# Patient Record
Sex: Male | Born: 1950 | Race: White | Hispanic: No | Marital: Married | Smoking: Never smoker
Health system: Southern US, Community
[De-identification: ages and names within clinical notes are randomized; demographics above are authoritative.]

## PROBLEM LIST (undated history)

## (undated) DIAGNOSIS — I10 Essential (primary) hypertension: Secondary | ICD-10-CM

## (undated) DIAGNOSIS — M1990 Unspecified osteoarthritis, unspecified site: Secondary | ICD-10-CM

## (undated) DIAGNOSIS — E119 Type 2 diabetes mellitus without complications: Secondary | ICD-10-CM

## (undated) DIAGNOSIS — R011 Cardiac murmur, unspecified: Secondary | ICD-10-CM

## (undated) DIAGNOSIS — Z8701 Personal history of pneumonia (recurrent): Secondary | ICD-10-CM

## (undated) DIAGNOSIS — R9431 Abnormal electrocardiogram [ECG] [EKG]: Secondary | ICD-10-CM

## (undated) HISTORY — DX: Essential (primary) hypertension: I10

## (undated) HISTORY — DX: Personal history of pneumonia (recurrent): Z87.01

## (undated) HISTORY — PX: OTHER SURGICAL HISTORY: SHX169

## (undated) HISTORY — DX: Type 2 diabetes mellitus without complications: E11.9

## (undated) HISTORY — DX: Unspecified osteoarthritis, unspecified site: M19.90

---

## 2016-03-14 DIAGNOSIS — H2513 Age-related nuclear cataract, bilateral: Secondary | ICD-10-CM | POA: Diagnosis not present

## 2016-03-14 DIAGNOSIS — H40033 Anatomical narrow angle, bilateral: Secondary | ICD-10-CM | POA: Diagnosis not present

## 2016-08-23 DIAGNOSIS — L308 Other specified dermatitis: Secondary | ICD-10-CM | POA: Diagnosis not present

## 2016-08-23 DIAGNOSIS — D0462 Carcinoma in situ of skin of left upper limb, including shoulder: Secondary | ICD-10-CM | POA: Diagnosis not present

## 2016-08-23 DIAGNOSIS — D225 Melanocytic nevi of trunk: Secondary | ICD-10-CM | POA: Diagnosis not present

## 2016-10-22 DIAGNOSIS — Z85828 Personal history of other malignant neoplasm of skin: Secondary | ICD-10-CM | POA: Diagnosis not present

## 2016-10-22 DIAGNOSIS — C44319 Basal cell carcinoma of skin of other parts of face: Secondary | ICD-10-CM | POA: Diagnosis not present

## 2016-10-22 DIAGNOSIS — Z08 Encounter for follow-up examination after completed treatment for malignant neoplasm: Secondary | ICD-10-CM | POA: Diagnosis not present

## 2016-11-22 DIAGNOSIS — C44319 Basal cell carcinoma of skin of other parts of face: Secondary | ICD-10-CM | POA: Diagnosis not present

## 2016-11-22 DIAGNOSIS — Z08 Encounter for follow-up examination after completed treatment for malignant neoplasm: Secondary | ICD-10-CM | POA: Diagnosis not present

## 2016-11-22 DIAGNOSIS — Z85828 Personal history of other malignant neoplasm of skin: Secondary | ICD-10-CM | POA: Diagnosis not present

## 2017-01-03 DIAGNOSIS — Z08 Encounter for follow-up examination after completed treatment for malignant neoplasm: Secondary | ICD-10-CM | POA: Diagnosis not present

## 2017-01-03 DIAGNOSIS — D225 Melanocytic nevi of trunk: Secondary | ICD-10-CM | POA: Diagnosis not present

## 2017-01-03 DIAGNOSIS — B078 Other viral warts: Secondary | ICD-10-CM | POA: Diagnosis not present

## 2017-01-03 DIAGNOSIS — Z85828 Personal history of other malignant neoplasm of skin: Secondary | ICD-10-CM | POA: Diagnosis not present

## 2017-05-02 DIAGNOSIS — L578 Other skin changes due to chronic exposure to nonionizing radiation: Secondary | ICD-10-CM | POA: Diagnosis not present

## 2017-05-02 DIAGNOSIS — L738 Other specified follicular disorders: Secondary | ICD-10-CM | POA: Diagnosis not present

## 2017-05-02 DIAGNOSIS — L718 Other rosacea: Secondary | ICD-10-CM | POA: Diagnosis not present

## 2017-08-26 DIAGNOSIS — Z08 Encounter for follow-up examination after completed treatment for malignant neoplasm: Secondary | ICD-10-CM | POA: Diagnosis not present

## 2017-08-26 DIAGNOSIS — C4441 Basal cell carcinoma of skin of scalp and neck: Secondary | ICD-10-CM | POA: Diagnosis not present

## 2017-08-26 DIAGNOSIS — Z85828 Personal history of other malignant neoplasm of skin: Secondary | ICD-10-CM | POA: Diagnosis not present

## 2017-08-26 DIAGNOSIS — D225 Melanocytic nevi of trunk: Secondary | ICD-10-CM | POA: Diagnosis not present

## 2017-09-26 DIAGNOSIS — Z08 Encounter for follow-up examination after completed treatment for malignant neoplasm: Secondary | ICD-10-CM | POA: Diagnosis not present

## 2017-09-26 DIAGNOSIS — Z85828 Personal history of other malignant neoplasm of skin: Secondary | ICD-10-CM | POA: Diagnosis not present

## 2018-02-06 DIAGNOSIS — L821 Other seborrheic keratosis: Secondary | ICD-10-CM | POA: Diagnosis not present

## 2018-11-24 ENCOUNTER — Encounter: Payer: Self-pay | Admitting: Family Medicine

## 2018-11-24 ENCOUNTER — Ambulatory Visit (INDEPENDENT_AMBULATORY_CARE_PROVIDER_SITE_OTHER): Payer: BLUE CROSS/BLUE SHIELD

## 2018-11-24 ENCOUNTER — Ambulatory Visit (INDEPENDENT_AMBULATORY_CARE_PROVIDER_SITE_OTHER): Payer: BLUE CROSS/BLUE SHIELD | Admitting: Family Medicine

## 2018-11-24 VITALS — BP 182/112 | HR 73 | Temp 97.3°F | Wt 225.0 lb

## 2018-11-24 DIAGNOSIS — Z1329 Encounter for screening for other suspected endocrine disorder: Secondary | ICD-10-CM

## 2018-11-24 DIAGNOSIS — Z125 Encounter for screening for malignant neoplasm of prostate: Secondary | ICD-10-CM | POA: Diagnosis not present

## 2018-11-24 DIAGNOSIS — Z1159 Encounter for screening for other viral diseases: Secondary | ICD-10-CM

## 2018-11-24 DIAGNOSIS — E1159 Type 2 diabetes mellitus with other circulatory complications: Secondary | ICD-10-CM | POA: Insufficient documentation

## 2018-11-24 DIAGNOSIS — Z23 Encounter for immunization: Secondary | ICD-10-CM | POA: Diagnosis not present

## 2018-11-24 DIAGNOSIS — Z7689 Persons encountering health services in other specified circumstances: Secondary | ICD-10-CM | POA: Diagnosis not present

## 2018-11-24 DIAGNOSIS — G8929 Other chronic pain: Secondary | ICD-10-CM | POA: Diagnosis not present

## 2018-11-24 DIAGNOSIS — M2170 Unequal limb length (acquired), unspecified site: Secondary | ICD-10-CM

## 2018-11-24 DIAGNOSIS — M1612 Unilateral primary osteoarthritis, left hip: Secondary | ICD-10-CM

## 2018-11-24 DIAGNOSIS — I10 Essential (primary) hypertension: Secondary | ICD-10-CM

## 2018-11-24 DIAGNOSIS — Z130 Encounter for screening for diseases of the blood and blood-forming organs and certain disorders involving the immune mechanism: Secondary | ICD-10-CM | POA: Diagnosis not present

## 2018-11-24 DIAGNOSIS — M25552 Pain in left hip: Secondary | ICD-10-CM | POA: Diagnosis not present

## 2018-11-24 MED ORDER — AMLODIPINE BESYLATE 5 MG PO TABS: 5.0000 mg | ORAL_TABLET | Freq: Every day | ORAL | 1 refills | Status: DC

## 2018-11-24 MED ORDER — PREDNISONE 10 MG (21) PO TBPK: ORAL_TABLET | ORAL | 0 refills | Status: DC

## 2018-11-24 NOTE — Progress Notes (Signed)
Subjective: DU:KGURKYHCW care, left hip pain HPI: Gary Lopez is a 67 y.o. male presenting to clinic today for:  1. Left hip pain Patient with a greater than 2-year history of left-sided hip pain.  He points to the anterior lateral aspect of the left hip as a source of pain.  Pain per his report seems to be getting better over last year but does occasionally have severe flares.  His wife who is present this agrees with this.  She notes a noticeable limp and has found that his back seems to be curving now.  She feels like the left lower extremity seems shorter than the right lower extremity.  Patient denies any preceding injury.  He reports a longstanding history of low back pain since he was younger.  Denies any sensation changes.  He does occasionally feel like he has some instability in the left lower extremity but denies any falls.  He has used Advil in efforts to improve pain.  No other therapies or evaluation tried.  2. HTN Patient's wife notes that she has been monitoring his blood pressures at home and they tend to be around 150s / 90s to 100s.  He denies any chest pain.  He occasionally has shortness of breath when he has colds but otherwise tolerates activity without difficulty when not limited by hip pain.  Denies any visual disturbance, dizziness.  Salt intake is moderate.  Wife notes that they are working on this.  She tries to keep him active but again left hip interferes with structured physical activity.  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  3.  Preventative Has not seen a doctor in many years.  He has not had any prostate screening, hepatitis C screening or colon cancer screening as of late.  Last ejaculation was greater than 24 hours ago.  He does report nocturia.  No other lower urinary tract symptoms.  He agrees to tetanus and pneumococcal vaccines but declines influenza vaccine today.  History reviewed. No pertinent past medical history. History  reviewed. No pertinent surgical history. Social History   Socioeconomic History  . Marital status: Married    Spouse name: Not on file  . Number of children: Not on file  . Years of education: Not on file  . Highest education level: Not on file  Occupational History  . Occupation: [employer] operational officer  . Financial resource strain: Not on file  . Food insecurity:    Worry: Not on file    Inability: Not on file  . Transportation needs:    Medical: Not on file    Non-medical: Not on file  Tobacco Use  . Smoking status: Never Smoker  . Smokeless tobacco: Never Used  Substance and Sexual Activity  . Alcohol use: Not Currently  . Drug use: Not Currently  . Sexual activity: Not Currently  Lifestyle  . Physical activity:    Days per week: Not on file    Minutes per session: Not on file  . Stress: Not on file  Relationships  . Social connections:    Talks on phone: Not on file    Gets together: Not on file    Attends religious service: Not on file    Active member of club or organization: Not on file    Attends meetings of clubs or organizations: Not on file    Relationship status: Not on file  . Intimate partner violence:    Fear of  current or ex partner: Not on file    Emotionally abused: Not on file    Physically abused: Not on file    Forced sexual activity: Not on file  Other Topics Concern  . Not on file  Social History Narrative  . Not on file   No outpatient medications have been marked as taking for the 11/24/18 encounter (Office Visit) with Janora Norlander, DO.   Family History  Problem Relation Age of Onset  . Heart disease Mother   . COPD Father    Not on File   Health Maintenance: declines flu.  Will take TDap and PNA vaccine ROS: Per HPI  Objective: Office vital signs reviewed. BP (!) 182/112   Pulse 73   Temp (!) 97.3 F (36.3 C)   Wt 225 lb (102.1 kg)   Physical Examination:  General: Awake, alert, well nourished, No acute  distress HEENT: sclera white, MMM Cardio: regular rate and rhythm, S1S2 heard, no murmurs appreciated Pulm: clear to auscultation bilaterally, no wheezes, rhonchi or rales; normal work of breathing on room air Extremities: warm, well perfused, No edema, cyanosis or clubbing; +2 pulses bilaterally MSK: antalgic gait and station  Lumbar spine: Some flattening noted.  There is a dextroscoliotic curve noted in the thoracolumbar region with notable right rib hump.  No midline tenderness to palpation.  Left hip: Patient has limited active range of motion in flexion.  He has no tenderness to palpation to the trochanteric bursa.  No palpable bony abnormalities.  There is about 1/2 inch leg length discrepancy with the left leg being shorter than the right.  Additionally, he has pain with FADIR.  Negative FABER. Skin: dry; intact; no rashes or lesions Neuro: 4/5 hip flexor, abductor and adductor Strength on left. Right w/ 4/5 abductor and adductor strength.  Light touch sensation grossly intact Psych: Mood stable, speech normal, affect appropriate.  Patient is somewhat reserved but pleasant.  Dg Hip Unilat W Or W/o Pelvis 2-3 Views Left  Result Date: 11/24/2018 CLINICAL DATA:  Pain without trauma EXAM: DG HIP (WITH OR WITHOUT PELVIS) 2-3V LEFT COMPARISON:  None. FINDINGS: Bone-on-bone apposition with subchondral cystic change involving the weight-bearing portion of the left femoral head and subchondral acetabular roof is identified. Findings likely represent advanced osteoarthritic change of the left hip. The bony pelvis appears intact. There is lower lumbar facet and degenerative disc disease of the included lumbar spine from L3 through S1. Slight joint space narrowing of the native right hip. The pubic rami are intact. No pelvic diastasis. IMPRESSION: Advanced osteoarthritic change of the left hip with bone-on-bone apposition. Electronically Signed   By: Ashley Royalty M.D.   On: 11/24/2018 16:56     Assessment/ Plan: 67 y.o. male   1. Chronic left hip pain Physical exam notable for pain with internal rotation of the left hip.  He also has limited active range of motion in that plane.  Nothing on exam to suggest trochanteric bursitis.  There is a visible leg length discrepancy and a scoliotic curve noted in the thoracic/lumbar spine.  X-rays were obtained which upon personal view did demonstrate quite a bit of narrowing of the hip joint.  There are degenerative changes appreciated on x-ray.  Radiologist agrees and his personal review notes degenerative changes to be advanced osteoarthritic changes of the left hip.  I have placed a referral to orthopedics for further evaluation.  In the interim, we will treat with oral prednisone.  We discussed avoiding NSAIDs for now  given significantly elevated blood pressure. - DG HIP UNILAT W OR W/O PELVIS 2-3 VIEWS LEFT; Future - Ambulatory referral to Orthopedic Surgery  2. Establishing care with new doctor, encounter for No prior records.  Will need to plan for screening colonoscopy at some point.  3. Leg length discrepancy As above - DG HIP UNILAT W OR W/O PELVIS 2-3 VIEWS LEFT; Future - Ambulatory referral to Orthopedic Surgery  4. Accelerated hypertension Asymptomatic. Blood pressure significantly elevated even upon manual recheck.  We discussed DASH diet.  I have started Norvasc 5 mg daily.  Home care instructions were reviewed.  They are to monitor blood pressures daily.  Instructions were reviewed and a handout was provided regarding proper blood pressure measuring.  He will follow-up in 2 weeks for blood pressure recheck.  If needed, we will increase dose of Norvasc at that visit.  He does have a DOT physical upcoming that he would like to be prepared for in 1 month.  Check CMP and lipid panel.  He is not fasting. - CMP14+EGFR - Lipid Panel  5. Screening for malignant neoplasm of prostate Last ejaculation greater than 24 hours ago.  He is  experiencing nocturia and likely has BPH.  Check PSA.  No baseline. - PSA  6. Screening, anemia, deficiency, iron - CBC  7. Screening for thyroid disorder - TSH  8. Encounter for hepatitis C screening test for low risk patient - Hepatitis C antibody  9. Primary osteoarthritis of left hip As above. - Ambulatory referral to Orthopedic Surgery  Meds ordered this encounter  Medications  . amLODipine (NORVASC) 5 MG tablet    Sig: Take 1 tablet (5 mg total) by mouth daily.    Dispense:  30 tablet    Refill:  1  . predniSONE (STERAPRED UNI-PAK 21 TAB) 10 MG (21) TBPK tablet    Sig: As directed x 6 days    Dispense:  21 tablet    Refill:  0   Orders Placed This Encounter  Procedures  . DG HIP UNILAT W OR W/O PELVIS 2-3 VIEWS LEFT    Standing Status:   Future    Number of Occurrences:   1    Standing Expiration Date:   01/23/2020    Order Specific Question:   Reason for Exam (SYMPTOM  OR DIAGNOSIS REQUIRED)    Answer:   chronic left hip pain, 1/2" leg length discrepancy. positive FADIR.    Order Specific Question:   Preferred imaging location?    Answer:   Internal  . Tdap vaccine greater than or equal to 7yo IM  . Pneumococcal conjugate vaccine 13-valent  . CMP14+EGFR  . CBC  . TSH  . Lipid Panel  . PSA  . Hepatitis C antibody  . Ambulatory referral to Orthopedic Surgery    Referral Priority:   Routine    Referral Type:   Surgical    Referral Reason:   Specialty Services Required    Requested Specialty:   Orthopedic Surgery    Number of Visits Requested:   [place], [place] [phone]  Total time spent with patient 45 minutes.  Greater than 50% of encounter spent in coordination of care/counseling.

## 2018-11-24 NOTE — Patient Instructions (Addendum)
You had labs performed today.  You will be contacted with the results of the labs once they are available, usually in the next 3 business days for routine lab work.   For your hip, I am putting you on a steroid dose pak.  Take with food each morning.  I am referring you to the hip specialist as well.  Start Amlodipine today.  Watch salt intake.  Monitor Blood pressure daily and record.  You got your Tetanus and Pneumonia shots today.   DASH Eating Plan DASH stands for "Dietary Approaches to Stop Hypertension." The DASH eating plan is a healthy eating plan that has been shown to reduce high blood pressure (hypertension). It may also reduce your risk for type 2 diabetes, heart disease, and stroke. The DASH eating plan may also help with weight loss. What are tips for following this plan? General guidelines  Avoid eating more than 2,300 mg (milligrams) of salt (sodium) a day. If you have hypertension, you may need to reduce your sodium intake to 1,500 mg a day.  Limit alcohol intake to no more than 1 drink a day for nonpregnant women and 2 drinks a day for men. One drink equals 12 oz of beer, 5 oz of wine, or 1 oz of hard liquor.  Work with your health care provider to maintain a healthy body weight or to lose weight. Ask what an ideal weight is for you.  Get at least 30 minutes of exercise that causes your heart to beat faster (aerobic exercise) most days of the week. Activities may include walking, swimming, or biking.  Work with your health care provider or diet and nutrition specialist (dietitian) to adjust your eating plan to your individual calorie needs. Reading food labels  Check food labels for the amount of sodium per serving. Choose foods with less than 5 percent of the Daily Value of sodium. Generally, foods with less than 300 mg of sodium per serving fit into this eating plan.  To find whole grains, look for the word "whole" as the first word in the ingredient  list. Shopping  Buy products labeled as "low-sodium" or "no salt added."  Buy fresh foods. Avoid canned foods and premade or frozen meals. Cooking  Avoid adding salt when cooking. Use salt-free seasonings or herbs instead of table salt or sea salt. Check with your health care provider or pharmacist before using salt substitutes.  Do not fry foods. Cook foods using healthy methods such as baking, boiling, grilling, and broiling instead.  Cook with heart-healthy oils, such as olive, canola, soybean, or sunflower oil. Meal planning   Eat a balanced diet that includes: ? 5 or more servings of fruits and vegetables each day. At each meal, try to fill half of your plate with fruits and vegetables. ? Up to 6-8 servings of whole grains each day. ? Less than 6 oz of lean meat, poultry, or fish each day. A 3-oz serving of meat is about the same size as a deck of cards. One egg equals 1 oz. ? 2 servings of low-fat dairy each day. ? A serving of nuts, seeds, or beans 5 times each week. ? Heart-healthy fats. Healthy fats called Omega-3 fatty acids are found in foods such as flaxseeds and coldwater fish, like sardines, salmon, and mackerel.  Limit how much you eat of the following: ? Canned or prepackaged foods. ? Food that is high in trans fat, such as fried foods. ? Food that is high in saturated fat,  such as fatty meat. ? Sweets, desserts, sugary drinks, and other foods with added sugar. ? Full-fat dairy products.  Do not salt foods before eating.  Try to eat at least 2 vegetarian meals each week.  Eat more home-cooked food and less restaurant, buffet, and fast food.  When eating at a restaurant, ask that your food be prepared with less salt or no salt, if possible. What foods are recommended? The items listed may not be a complete list. Talk with your dietitian about what dietary choices are best for you. Grains Whole-grain or whole-wheat bread. Whole-grain or whole-wheat pasta. Brown  rice. [place]. Bulgur. Whole-grain and low-sodium cereals. Pita bread. Low-fat, low-sodium crackers. Whole-wheat flour tortillas. Vegetables Fresh or frozen vegetables (raw, steamed, roasted, or grilled). Low-sodium or reduced-sodium tomato and vegetable juice. Low-sodium or reduced-sodium tomato sauce and tomato paste. Low-sodium or reduced-sodium canned vegetables. Fruits All fresh, dried, or frozen fruit. Canned fruit in natural juice (without added sugar). Meat and other protein foods Skinless chicken or [place]. Ground chicken or [place]. Pork with fat trimmed off. Fish and seafood. Egg whites. Dried beans, peas, or lentils. Unsalted nuts, nut butters, and seeds. Unsalted canned beans. Lean cuts of beef with fat trimmed off. Low-sodium, lean deli meat. Dairy Low-fat (1%) or fat-free (skim) milk. Fat-free, low-fat, or reduced-fat cheeses. Nonfat, low-sodium ricotta or cottage cheese. Low-fat or nonfat yogurt. Low-fat, low-sodium cheese. Fats and oils Soft margarine without trans fats. Vegetable oil. Low-fat, reduced-fat, or light mayonnaise and salad dressings (reduced-sodium). Canola, safflower, olive, soybean, and sunflower oils. Avocado. Seasoning and other foods Herbs. Spices. Seasoning mixes without salt. Unsalted popcorn and pretzels. Fat-free sweets. What foods are not recommended? The items listed may not be a complete list. Talk with your dietitian about what dietary choices are best for you. Grains Baked goods made with fat, such as croissants, muffins, or some breads. Dry pasta or rice meal packs. Vegetables Creamed or fried vegetables. Vegetables in a cheese sauce. Regular canned vegetables (not low-sodium or reduced-sodium). Regular canned tomato sauce and paste (not low-sodium or reduced-sodium). Regular tomato and vegetable juice (not low-sodium or reduced-sodium). Angie Fava. Olives. Fruits Canned fruit in a light or heavy syrup. Fried fruit. Fruit in cream or butter  sauce. Meat and other protein foods Fatty cuts of meat. Ribs. Fried meat. Berniece Salines. Sausage. Bologna and other processed lunch meats. Salami. Fatback. Hotdogs. Bratwurst. Salted nuts and seeds. Canned beans with added salt. Canned or smoked fish. Whole eggs or egg yolks. Chicken or [place] with skin. Dairy Whole or 2% milk, cream, and half-and-half. Whole or full-fat cream cheese. Whole-fat or sweetened yogurt. Full-fat cheese. Nondairy creamers. Whipped toppings. Processed cheese and cheese spreads. Fats and oils Butter. Stick margarine. Lard. Shortening. Ghee. Bacon fat. Tropical oils, such as coconut, palm kernel, or palm oil. Seasoning and other foods Salted popcorn and pretzels. Onion salt, garlic salt, seasoned salt, table salt, and sea salt. Worcestershire sauce. Tartar sauce. Barbecue sauce. Teriyaki sauce. Soy sauce, including reduced-sodium. Steak sauce. Canned and packaged gravies. Fish sauce. Oyster sauce. Cocktail sauce. Horseradish that you find on the shelf. Ketchup. Mustard. Meat flavorings and tenderizers. Bouillon cubes. Hot sauce and Tabasco sauce. Premade or packaged marinades. Premade or packaged taco seasonings. Relishes. Regular salad dressings. Where to find more information:  National Heart, Lung, and [place]: https://wilson-eaton.com/  American Heart Association: www.heart.org Summary  The DASH eating plan is a healthy eating plan that has been shown to reduce high blood pressure (hypertension). It may also reduce your  risk for type 2 diabetes, heart disease, and stroke.  With the DASH eating plan, you should limit salt (sodium) intake to 2,300 mg a day. If you have hypertension, you may need to reduce your sodium intake to 1,500 mg a day.  When on the DASH eating plan, aim to eat more fresh fruits and vegetables, whole grains, lean proteins, low-fat dairy, and heart-healthy fats.  Work with your health care provider or diet and nutrition specialist (dietitian) to adjust  your eating plan to your individual calorie needs. This information is not intended to replace advice given to you by your health care provider. Make sure you discuss any questions you have with your health care provider. Document Released: 11/15/2011 Document Revised: 11/19/2016 Document Reviewed: 11/19/2016 Elsevier Interactive Patient Education  2018 [employer].   How to Take Your Blood Pressure You can take your blood pressure at home with a machine. You may need to check your blood pressure at home:  To check if you have high blood pressure (hypertension).  To check your blood pressure over time.  To make sure your blood pressure medicine is working.  Supplies needed: You will need a blood pressure machine, or monitor. You can buy one at a drugstore or online. When choosing one:  Choose one with an arm cuff.  Choose one that wraps around your upper arm. Only one finger should fit between your arm and the cuff.  Do not choose one that measures your blood pressure from your wrist or finger.  Your doctor can suggest a monitor. How to prepare Avoid these things for 30 minutes before checking your blood pressure:  Drinking caffeine.  Drinking alcohol.  Eating.  Smoking.  Exercising.  Five minutes before checking your blood pressure:  Pee.  Sit in a dining chair. Avoid sitting in a soft couch or armchair.  Be quiet. Do not talk.  How to take your blood pressure Follow the instructions that came with your machine. If you have a digital blood pressure monitor, these may be the instructions: 1. Sit up straight. 2. Place your feet on the floor. Do not cross your ankles or legs. 3. Rest your left arm at the level of your heart. You may rest it on a table, desk, or chair. 4. Pull up your shirt sleeve. 5. Wrap the blood pressure cuff around the upper part of your left arm. The cuff should be 1 inch (2.5 cm) above your elbow. It is best to wrap the cuff around bare  skin. 6. Fit the cuff snugly around your arm. You should be able to place only one finger between the cuff and your arm. 7. Put the cord inside the groove of your elbow. 8. Press the power button. 9. Sit quietly while the cuff fills with air and loses air. 10. Write down the numbers on the screen. 11. Wait 2-3 minutes and then repeat steps 1-10.  What do the numbers mean? Two numbers make up your blood pressure. The first number is called systolic pressure. The second is called diastolic pressure. An example of a blood pressure reading is "120 over 80" (or 120/80). If you are an adult and do not have a medical condition, use this guide to find out if your blood pressure is normal: Normal  First number: below 120.  Second number: below 80. Elevated  First number: 120-129.  Second number: below 80. Hypertension stage 1  First number: 130-139.  Second number: 80-89. Hypertension stage 2  First number:  140 or above.  Second number: 52 or above. Your blood pressure is above normal even if only the top or bottom number is above normal. Follow these instructions at home:  Check your blood pressure as often as your doctor tells you to.  Take your monitor to your next doctor's appointment. Your doctor will: ? Make sure you are using it correctly. ? Make sure it is working right.  Make sure you understand what your blood pressure numbers should be.  Tell your doctor if your medicines are causing side effects. Contact a doctor if:  Your blood pressure keeps being high. Get help right away if:  Your first blood pressure number is higher than 180.  Your second blood pressure number is higher than 120. This information is not intended to replace advice given to you by your health care provider. Make sure you discuss any questions you have with your health care provider. Document Released: 11/08/2008 Document Revised: 10/24/2016 Document Reviewed: 05/04/2016 Elsevier Interactive  Patient Education  [employer].

## 2018-11-25 ENCOUNTER — Other Ambulatory Visit: Payer: Self-pay | Admitting: Family Medicine

## 2018-11-25 LAB — CMP14+EGFR
ALT: 35 IU/L (ref 0–44)
AST: 30 IU/L (ref 0–40)
Albumin/Globulin Ratio: 1.5 (ref 1.2–2.2)
Albumin: 4.6 g/dL (ref 3.6–4.8)
Alkaline Phosphatase: 75 IU/L (ref 39–117)
BUN/Creatinine Ratio: 16 (ref 10–24)
BUN: 18 mg/dL (ref 8–27)
Bilirubin Total: 0.3 mg/dL (ref 0.0–1.2)
CO2: 25 mmol/L (ref 20–29)
Calcium: 10.3 mg/dL — ABNORMAL HIGH (ref 8.6–10.2)
Chloride: 102 mmol/L (ref 96–106)
Creatinine, Ser: 1.13 mg/dL (ref 0.76–1.27)
GFR calc Af Amer: 77 mL/min/{1.73_m2} (ref >59)
GFR calc non Af Amer: 66 mL/min/{1.73_m2} (ref >59)
Globulin, Total: 3 g/dL (ref 1.5–4.5)
Glucose: 127 mg/dL — ABNORMAL HIGH (ref 65–99)
Potassium: 4.3 mmol/L (ref 3.5–5.2)
Sodium: 142 mmol/L (ref 134–144)
Total Protein: 7.6 g/dL (ref 6.0–8.5)

## 2018-11-25 LAB — CBC
HEMOGLOBIN: 15.7 g/dL (ref 13.0–17.7)
Hematocrit: 44.7 % (ref 37.5–51.0)
MCH: 30 pg (ref 26.6–33.0)
MCHC: 35.1 g/dL (ref 31.5–35.7)
MCV: 85 fL (ref 79–97)
Platelets: 313 10*3/uL (ref 150–450)
RBC: 5.24 x10E6/uL (ref 4.14–5.80)
RDW: 13.1 % (ref 12.3–15.4)
WBC: 13.2 10*3/uL — ABNORMAL HIGH (ref 3.4–10.8)

## 2018-11-25 LAB — PSA: Prostate Specific Ag, Serum: 0.4 ng/mL (ref 0.0–4.0)

## 2018-11-25 LAB — LIPID PANEL
Chol/HDL Ratio: 5.7 ratio — ABNORMAL HIGH (ref 0.0–5.0)
Cholesterol, Total: 189 mg/dL (ref 100–199)
HDL: 33 mg/dL — AB (ref >39)
LDL Calculated: 133 mg/dL — ABNORMAL HIGH (ref 0–99)
Triglycerides: 117 mg/dL (ref 0–149)
VLDL CHOLESTEROL CAL: 23 mg/dL (ref 5–40)

## 2018-11-25 LAB — TSH: TSH: 1.89 u[IU]/mL (ref 0.450–4.500)

## 2018-11-25 MED ORDER — ATORVASTATIN CALCIUM 40 MG PO TABS: 40.0000 mg | ORAL_TABLET | Freq: Every day | ORAL | 1 refills | Status: DC

## 2018-11-25 NOTE — Progress Notes (Signed)
pit

## 2018-12-08 ENCOUNTER — Ambulatory Visit (INDEPENDENT_AMBULATORY_CARE_PROVIDER_SITE_OTHER): Payer: BLUE CROSS/BLUE SHIELD | Admitting: Family Medicine

## 2018-12-08 ENCOUNTER — Encounter: Payer: Self-pay | Admitting: Family Medicine

## 2018-12-08 VITALS — BP 147/87 | HR 90 | Temp 97.8°F | Ht 68.0 in | Wt 224.0 lb

## 2018-12-08 DIAGNOSIS — E782 Mixed hyperlipidemia: Secondary | ICD-10-CM | POA: Insufficient documentation

## 2018-12-08 DIAGNOSIS — I10 Essential (primary) hypertension: Secondary | ICD-10-CM | POA: Diagnosis not present

## 2018-12-08 DIAGNOSIS — M25552 Pain in left hip: Secondary | ICD-10-CM

## 2018-12-08 DIAGNOSIS — G8929 Other chronic pain: Secondary | ICD-10-CM

## 2018-12-08 MED ORDER — AMLODIPINE BESYLATE 10 MG PO TABS: 10.0000 mg | ORAL_TABLET | Freq: Every day | ORAL | 0 refills | Status: DC

## 2018-12-08 NOTE — Progress Notes (Signed)
Subjective: CC: follow up HTN HPI: Gary Lopez is a 67 y.o. male presenting to clinic today for:  1. HTN Patient's wife notes that she has been monitoring his blood pressures at home and they tend to be around 150s / 90s to 100s.  He has been tolerating Norvasc 5 mg daily without difficulty.  Denies any lower extremity edema or lightheadedness.  He has been working on reducing salt intake in diet.  2.  Hyperlipidemia Patient has not started the Lipitor 40 mg daily yet.  The medication was prescribed recently for ASCVD risk score of 23.4%.  Again, no chest pain, shortness of breath.  Lifestyle modification as above.  3.  Degenerative hip Patient noted to have substantial degenerative changes in his left hip at last visit.  He has an appointment with orthopedics on 12/15/2018.  His wife is asking if we can get a handicap placard, as he is having some difficulty with ambulation long distances.   No past medical history on file. No past surgical history on file. Social History   Socioeconomic History  . Marital status: Married    Spouse name: Not on file  . Number of children: Not on file  . Years of education: Not on file  . Highest education level: Not on file  Occupational History  . Occupation: [employer] operational officer  . Financial resource strain: Not on file  . Food insecurity:    Worry: Not on file    Inability: Not on file  . Transportation needs:    Medical: Not on file    Non-medical: Not on file  Tobacco Use  . Smoking status: Never Smoker  . Smokeless tobacco: Never Used  Substance and Sexual Activity  . Alcohol use: Not Currently  . Drug use: Not Currently  . Sexual activity: Not Currently  Lifestyle  . Physical activity:    Days per week: Not on file    Minutes per session: Not on file  . Stress: Not on file  Relationships  . Social connections:    Talks on phone: Not on file    Gets together: Not on file    Attends religious service: Not on file   Active member of club or organization: Not on file    Attends meetings of clubs or organizations: Not on file    Relationship status: Not on file  . Intimate partner violence:    Fear of current or ex partner: Not on file    Emotionally abused: Not on file    Physically abused: Not on file    Forced sexual activity: Not on file  Other Topics Concern  . Not on file  Social History Narrative  . Not on file    Current Outpatient Medications:  .  amLODipine (NORVASC) 5 MG tablet, Take 1 tablet (5 mg total) by mouth daily., Disp: 30 tablet, Rfl: 1 .  atorvastatin (LIPITOR) 40 MG tablet, Take 1 tablet (40 mg total) by mouth daily. (Patient not taking: Reported on 12/08/2018), Disp: 90 tablet, Rfl: 1  Family History  Problem Relation Age of Onset  . Heart disease Mother   . COPD Father    Not on File   Health Maintenance: declines flu.  Will take TDap and PNA vaccine ROS: Per HPI  Objective: Office vital signs reviewed. BP (!) 147/87   Pulse 90   Temp 97.8 F (36.6 C) (Oral)   Ht 5\' 8"  (1.727 m)   Wt 224 lb (101.6 kg)  BMI 34.06 kg/m   Physical Examination:  General: Awake, alert, well nourished, No acute distress HEENT: sclera white, MMM, poor dentition Cardio: regular rate and rhythm, S1S2 heard, no murmurs appreciated Pulm: clear to auscultation bilaterally, no wheezes, rhonchi or rales; normal work of breathing on room air Extremities: warm, well perfused, No edema, cyanosis or clubbing; +2 pulses bilaterally   Assessment/ Plan: 67 y.o. male   1. Essential hypertension Technically controlled for age upon second recheck but think that patient would benefit from tighter control, particularly given home blood pressures that are persistently in the 150s over 90s.  I have increased his Norvasc to 10 mg daily.  He will return in 1 week to see the nurse for blood pressure recheck.  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  He will see me in 3 months for follow-up on blood  pressure and cholesterol.  2. Chronic left hip pain Has appointment with orthopedics.  I have given him a handicap placard and a copy of this has been scanned in his chart.  May need permanent placard pending orthopedic evaluation and management.  3. Mixed hyperlipidemia We discussed starting the Lipitor 40 mg daily.  Plan for lipid panel in 3 months.   Meds ordered this encounter  Medications  . amLODipine (NORVASC) 10 MG tablet    Sig: Take 1 tablet (10 mg total) by mouth daily.    Dispense:  90 tablet    Refill:  0   No orders of the defined types were placed in this encounter.  Janora Norlander, DO [place] [phone]

## 2018-12-08 NOTE — Patient Instructions (Signed)
Your blood pressure is borderline.  I am increasing your amlodipine to 10 mg daily.  You may take 2 of your 5 mg to equal this dose into you finish your current bottle.  Then switch over to 1 tablet daily of the new prescription.  Follow-up in 1 week for blood pressure recheck with the nurse.  See me back in 3 months for cholesterol check.

## 2018-12-15 DIAGNOSIS — M25552 Pain in left hip: Secondary | ICD-10-CM | POA: Diagnosis not present

## 2018-12-30 DIAGNOSIS — M1612 Unilateral primary osteoarthritis, left hip: Secondary | ICD-10-CM | POA: Diagnosis not present

## 2018-12-30 DIAGNOSIS — M25552 Pain in left hip: Secondary | ICD-10-CM | POA: Diagnosis not present

## 2019-01-05 ENCOUNTER — Encounter: Payer: Self-pay | Admitting: Family Medicine

## 2019-01-05 ENCOUNTER — Ambulatory Visit (INDEPENDENT_AMBULATORY_CARE_PROVIDER_SITE_OTHER): Payer: BLUE CROSS/BLUE SHIELD | Admitting: Family Medicine

## 2019-01-05 VITALS — BP 142/93 | HR 80 | Temp 97.8°F | Ht 68.0 in | Wt 225.0 lb

## 2019-01-05 DIAGNOSIS — Z01818 Encounter for other preprocedural examination: Secondary | ICD-10-CM

## 2019-01-05 DIAGNOSIS — I10 Essential (primary) hypertension: Secondary | ICD-10-CM | POA: Diagnosis not present

## 2019-01-05 MED ORDER — LOSARTAN POTASSIUM 25 MG PO TABS: 25.0000 mg | ORAL_TABLET | Freq: Every day | ORAL | 0 refills | Status: DC

## 2019-01-05 NOTE — Progress Notes (Signed)
Subjective: CC: follow up HTN HPI: Gary Lopez is a 68 y.o. male presenting to clinic today for:  1. Hypertension Blood pressure has remained persistently elevated.  Most blood pressure checks at home run 130s to 140s over 80s to 90s.  He is not checking these consistently at the same time every day and sometimes will check them multiple times in a day.  He denies any chest pain, shortness of breath, visual disturbance or lower extremity edema.  He is tolerating the amlodipine ~grams without difficulty.  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  He is currently on a 58-month probation period.  He is a non-smoker.  He has been reducing salt in diet.  He does report increased anxiety surrounding the DOT physical and thinks that this was also contributing.  His blood pressure at that visit was noted to be 158/100.  2. Preop evaluation Gary Lopez is needing preoperative clearance for planned left total hip arthroplasty.  He reports continued leg length discrepancy and left-sided hip pain.  Noted to have significant degenerative changes within the left hip.  He will be seeing Dr. Lyla Glassing at [facility] for this.  1) High Risk Cardiac Conditions  1) Recent MI - No.  2) Decompensated Heart Failure - No.  3) Unstable angina - No.  4) Symptomatic arrythmia - No.  5) Sx Valvular Disease - No.  2) Intermediate Risk Factors - DM, CKD, CVA, CHF, CAD - No.  2) Functional Status - > 4 mets (Walk, run, climb stairs) Yes.  Gary Lopez Activity Status Index: 50.2  3) Surgery Specific Risk -  Intermediate (Carotid, Head and Neck, Orthopaedic )     4) Further Noninvasive evaluation -   1) EKG - No.   1) Hx of CVA, CAD, DM, CKD  2) Echo - No.   1) Worsening dyspnea   3) Stress Testing - Active Cardiac Disease - No.  5) Need for medical therapy - Beta Blocker, Statins indicated ? No.   Past Medical History:  Diagnosis Date  . Arthritis   .  Hypertension    No past surgical history on file. Social History   Socioeconomic History  . Marital status: Married    Spouse name: Not on file  . Number of children: Not on file  . Years of education: Not on file  . Highest education level: Not on file  Occupational History  . Occupation: [employer] operational officer  . Financial resource strain: Not on file  . Food insecurity:    Worry: Not on file    Inability: Not on file  . Transportation needs:    Medical: Not on file    Non-medical: Not on file  Tobacco Use  . Smoking status: Never Smoker  . Smokeless tobacco: Never Used  Substance and Sexual Activity  . Alcohol use: Not Currently  . Drug use: Not Currently  . Sexual activity: Not Currently  Lifestyle  . Physical activity:    Days per week: Not on file    Minutes per session: Not on file  . Stress: Not on file  Relationships  . Social connections:    Talks on phone: Not on file    Gets together: Not on file    Attends religious service: Not on file    Active member of club or organization: Not on file    Attends meetings of clubs or organizations: Not on file  Relationship status: Not on file  . Intimate partner violence:    Fear of current or ex partner: Not on file    Emotionally abused: Not on file    Physically abused: Not on file    Forced sexual activity: Not on file  Other Topics Concern  . Not on file  Social History Narrative  . Not on file    Current Outpatient Medications:  .  amLODipine (NORVASC) 10 MG tablet, Take 1 tablet (10 mg total) by mouth daily., Disp: 90 tablet, Rfl: 0 .  atorvastatin (LIPITOR) 40 MG tablet, Take 1 tablet (40 mg total) by mouth daily., Disp: 90 tablet, Rfl: 1  Family History  Problem Relation Age of Onset  . Heart disease Mother   . COPD Father    Not on File   Health Maintenance: Colon cancer screen, Hep C screen ROS: Per HPI  Objective: Office vital signs reviewed. BP (!) 142/93   Pulse 80   Temp 97.8  F (36.6 C) (Oral)   Ht 5\' 8"  (1.727 m)   Wt 225 lb (102.1 kg)   BMI 34.21 kg/m   Physical Examination:  General: Awake, alert, well nourished, No acute distress HEENT: sclera white, MMM, poor dentition; Mallampati 3-4 Cardio: regular rate and rhythm, S1S2 heard, no murmurs appreciated Pulm: clear to auscultation bilaterally, no wheezes, rhonchi or rales; normal work of breathing on room air Extremities: warm, well perfused, No edema, cyanosis or clubbing; +2 pulses bilaterally  Assessment/ Plan: 68 y.o. male    1. Uncontrolled hypertension Blood pressure continues to be outside of goal.  We discussed goal is less than 140/90.  Add losartan 25 mg daily to Norvasc 10 mg daily.  We discussed proper blood pressure monitoring and a handout was provided.  Continue to monitor blood pressures daily at the same time every day and record.  He will follow-up in 1 week for blood pressure recheck with the nurse as well as repeat BMP given initiation of ARB.  We will see each other in 1 month, sooner if needed.  If blood pressure continues to be outside of range, we will plan to increase losartan to 50 mg. - Basic Metabolic Panel; Future  2. Pre-op exam I have independently evaluated patient.  Gary Lopez is a 68 y.o. male who is low risk for an intermediate risk surgery.  There are modifiable risk factors (BP optimization, weight reduction).  Gary Lopez's RCRI calculation for MACE is: 0 (3.9% risk).     Meds ordered this encounter  Medications  . losartan (COZAAR) 25 MG tablet    Sig: Take 1 tablet (25 mg total) by mouth daily.    Dispense:  30 tablet    Refill:  0   Orders Placed This Encounter  Procedures  . Basic Metabolic Panel    Standing Status:   Future    Standing Expiration Date:   01/06/2020   Total time spent with patient 26 minutes.  Greater than 50% of encounter spent in coordination of care/counseling.  Janora Norlander, DO [place] [phone]

## 2019-01-05 NOTE — Patient Instructions (Addendum)
Continue the Amlodipine 10mg  daily.  We are adding Losartan 25mg  daily.  You may separate these into am and pm if you'd like.  We discussed how to properly take a blood pressure today.  Continue monitoring BP.  Come in for a blood pressure check with the nurse next week and get your lab redrawn.  We will see each other back in 3-4 weeks for BP recheck w/ me.  I will fax your pre-op clearance form to the orthopedist.  How to Take Your Blood Pressure You can take your blood pressure at home with a machine. You may need to check your blood pressure at home:  To check if you have high blood pressure (hypertension).  To check your blood pressure over time.  To make sure your blood pressure medicine is working. Supplies needed: You will need a blood pressure machine, or monitor. You can buy one at a drugstore or online. When choosing one:  Choose one with an arm cuff.  Choose one that wraps around your upper arm. Only one finger should fit between your arm and the cuff.  Do not choose one that measures your blood pressure from your wrist or finger. Your doctor can suggest a monitor. How to prepare Avoid these things for 30 minutes before checking your blood pressure:  Drinking caffeine.  Drinking alcohol.  Eating.  Smoking.  Exercising. Five minutes before checking your blood pressure:  Pee.  Sit in a dining chair. Avoid sitting in a soft couch or armchair.  Be quiet. Do not talk. How to take your blood pressure Follow the instructions that came with your machine. If you have a digital blood pressure monitor, these may be the instructions: 1. Sit up straight. 2. Place your feet on the floor. Do not cross your ankles or legs. 3. Rest your left arm at the level of your heart. You may rest it on a table, desk, or chair. 4. Pull up your shirt sleeve. 5. Wrap the blood pressure cuff around the upper part of your left arm. The cuff should be 1 inch (2.5 cm) above your elbow. It is  best to wrap the cuff around bare skin. 6. Fit the cuff snugly around your arm. You should be able to place only one finger between the cuff and your arm. 7. Put the cord inside the groove of your elbow. 8. Press the power button. 9. Sit quietly while the cuff fills with air and loses air. 10. Write down the numbers on the screen. 11. Wait 2-3 minutes and then repeat steps 1-10. What do the numbers mean? Two numbers make up your blood pressure. The first number is called systolic pressure. The second is called diastolic pressure. An example of a blood pressure reading is "120 over 80" (or 120/80). If you are an adult and do not have a medical condition, use this guide to find out if your blood pressure is normal: Normal  First number: below 120.  Second number: below 80. Elevated  First number: 120-129.  Second number: below 80. Hypertension stage 1  First number: 130-139.  Second number: 80-89. Hypertension stage 2  First number: 140 or above.  Second number: 44 or above. Your blood pressure is above normal even if only the top or bottom number is above normal. Follow these instructions at home:  Check your blood pressure as often as your doctor tells you to.  Take your monitor to your next doctor's appointment. Your doctor will: ? Make sure you  are using it correctly. ? Make sure it is working right.  Make sure you understand what your blood pressure numbers should be.  Tell your doctor if your medicines are causing side effects. Contact a doctor if:  Your blood pressure keeps being high. Get help right away if:  Your first blood pressure number is higher than 180.  Your second blood pressure number is higher than 120. This information is not intended to replace advice given to you by your health care provider. Make sure you discuss any questions you have with your health care provider. Document Released: 11/08/2008 Document Revised: 10/24/2016 Document Reviewed:  05/04/2016 Elsevier Interactive Patient Education  2019 [employer].

## 2019-01-17 ENCOUNTER — Other Ambulatory Visit: Payer: BLUE CROSS/BLUE SHIELD

## 2019-01-17 ENCOUNTER — Telehealth: Payer: Self-pay | Admitting: *Deleted

## 2019-01-17 DIAGNOSIS — I10 Essential (primary) hypertension: Secondary | ICD-10-CM | POA: Diagnosis not present

## 2019-01-17 NOTE — Telephone Encounter (Signed)
FYI for Dr. Lajuana Ripple  Patient had blood pressure recheck while visiting lab on Saturday.   His reading was 129/77 with heart rate of 95.

## 2019-01-18 LAB — BASIC METABOLIC PANEL
BUN/Creatinine Ratio: 17 (ref 10–24)
BUN: 17 mg/dL (ref 8–27)
CO2: 23 mmol/L (ref 20–29)
Calcium: 9.4 mg/dL (ref 8.6–10.2)
Chloride: 101 mmol/L (ref 96–106)
Creatinine, Ser: 0.99 mg/dL (ref 0.76–1.27)
GFR calc Af Amer: 90 mL/min/{1.73_m2} (ref >59)
GFR calc non Af Amer: 78 mL/min/{1.73_m2} (ref >59)
Glucose: 114 mg/dL — ABNORMAL HIGH (ref 65–99)
Potassium: 4.3 mmol/L (ref 3.5–5.2)
Sodium: 140 mmol/L (ref 134–144)

## 2019-01-19 ENCOUNTER — Encounter: Payer: Self-pay | Admitting: *Deleted

## 2019-01-29 ENCOUNTER — Telehealth: Payer: Self-pay | Admitting: Family Medicine

## 2019-01-29 NOTE — Telephone Encounter (Signed)
Pt declined

## 2019-01-30 ENCOUNTER — Other Ambulatory Visit: Payer: Self-pay | Admitting: Family Medicine

## 2019-01-30 MED ORDER — LOSARTAN POTASSIUM 25 MG PO TABS: 25.0000 mg | ORAL_TABLET | Freq: Every day | ORAL | 2 refills | Status: DC

## 2019-01-30 NOTE — Telephone Encounter (Signed)
Refill sent.

## 2019-03-02 ENCOUNTER — Other Ambulatory Visit: Payer: Self-pay | Admitting: Family Medicine

## 2019-03-02 MED ORDER — AMLODIPINE BESYLATE 10 MG PO TABS: 10.0000 mg | ORAL_TABLET | Freq: Every day | ORAL | 0 refills | Status: DC

## 2019-03-02 NOTE — Telephone Encounter (Signed)
Med sent.

## 2019-03-09 ENCOUNTER — Ambulatory Visit (INDEPENDENT_AMBULATORY_CARE_PROVIDER_SITE_OTHER): Payer: BLUE CROSS/BLUE SHIELD | Admitting: Family Medicine

## 2019-03-09 ENCOUNTER — Other Ambulatory Visit: Payer: Self-pay

## 2019-03-09 VITALS — BP 134/80 | HR 88 | Temp 98.6°F | Ht 68.0 in | Wt 225.0 lb

## 2019-03-09 DIAGNOSIS — R7309 Other abnormal glucose: Secondary | ICD-10-CM

## 2019-03-09 DIAGNOSIS — E782 Mixed hyperlipidemia: Secondary | ICD-10-CM | POA: Diagnosis not present

## 2019-03-09 DIAGNOSIS — I10 Essential (primary) hypertension: Secondary | ICD-10-CM | POA: Diagnosis not present

## 2019-03-09 DIAGNOSIS — D72829 Elevated white blood cell count, unspecified: Secondary | ICD-10-CM

## 2019-03-09 DIAGNOSIS — E119 Type 2 diabetes mellitus without complications: Secondary | ICD-10-CM | POA: Insufficient documentation

## 2019-03-09 LAB — BAYER DCA HB A1C WAIVED: HB A1C (BAYER DCA - WAIVED): 7.7 % — ABNORMAL HIGH (ref <7.0)

## 2019-03-09 MED ORDER — ATORVASTATIN CALCIUM 40 MG PO TABS: 40.0000 mg | ORAL_TABLET | Freq: Every day | ORAL | 3 refills | Status: DC

## 2019-03-09 MED ORDER — LOSARTAN POTASSIUM 25 MG PO TABS: 25.0000 mg | ORAL_TABLET | Freq: Every day | ORAL | 3 refills | Status: DC

## 2019-03-09 MED ORDER — AMLODIPINE BESYLATE 10 MG PO TABS: 10.0000 mg | ORAL_TABLET | Freq: Every day | ORAL | 3 refills | Status: DC

## 2019-03-09 NOTE — Progress Notes (Signed)
Subjective: CC: HTN follow up PCP: Janora Norlander, DO Gary Lopez is a 68 y.o. male presenting to clinic today for:  1. Hypertension Patient reports good control of blood pressures at home.  Typically they are running 130 over 80s.  He has had a couple of diastolics in the 24M but this typically seems to be after he has been active.  He reports compliance with amlodipine and the losartan.  No lower extremity edema, chest pain or shortness of breath.  2. Elevated blood sugar He eats jellybeans and ice cream quite frequently.  He does not follow a specific diet and does incorporate quite a bit of carbohydrates within his diet.  He drinks water pretty frequently.  3.  Hyperlipidemia Reports compliance with Lipitor 40 mg daily.  No myalgia but continues to have left-sided hip pain.  Elective surgery has been put off due to COVID-19 outbreak.  ROS: Per HPI  Not on File Past Medical History:  Diagnosis Date  . Arthritis   . Hypertension     Current Outpatient Medications:  .  amLODipine (NORVASC) 10 MG tablet, Take 1 tablet (10 mg total) by mouth daily., Disp: 90 tablet, Rfl: 0 .  atorvastatin (LIPITOR) 40 MG tablet, Take 1 tablet (40 mg total) by mouth daily., Disp: 90 tablet, Rfl: 1 .  losartan (COZAAR) 25 MG tablet, Take 1 tablet (25 mg total) by mouth daily., Disp: 30 tablet, Rfl: 2 Social History   Socioeconomic History  . Marital status: Married    Spouse name: Not on file  . Number of children: Not on file  . Years of education: Not on file  . Highest education level: Not on file  Occupational History  . Occupation: [employer] operational officer  . Financial resource strain: Not on file  . Food insecurity:    Worry: Not on file    Inability: Not on file  . Transportation needs:    Medical: Not on file    Non-medical: Not on file  Tobacco Use  . Smoking status: Never Smoker  . Smokeless tobacco: Never Used  Substance and Sexual Activity  . Alcohol use: Not  Currently  . Drug use: Not Currently  . Sexual activity: Not Currently  Lifestyle  . Physical activity:    Days per week: Not on file    Minutes per session: Not on file  . Stress: Not on file  Relationships  . Social connections:    Talks on phone: Not on file    Gets together: Not on file    Attends religious service: Not on file    Active member of club or organization: Not on file    Attends meetings of clubs or organizations: Not on file    Relationship status: Not on file  . Intimate partner violence:    Fear of current or ex partner: Not on file    Emotionally abused: Not on file    Physically abused: Not on file    Forced sexual activity: Not on file  Other Topics Concern  . Not on file  Social History Narrative  . Not on file   Family History  Problem Relation Age of Onset  . Heart disease Mother   . COPD Father     Objective: Office vital signs reviewed. BP 134/80   Pulse 88   Temp 98.6 F (37 C) (Oral)   Ht 5\' 8"  (1.727 m)   Wt 225 lb (102.1 kg)   BMI 34.21 kg/m  Physical Examination:  General: Awake, alert, well nourished, obese. No acute distress HEENT: Normal, sclera white, MMM Cardio: regular rate and rhythm, S1S2 heard, no murmurs appreciated Pulm: clear to auscultation bilaterally, no wheezes, rhonchi or rales; normal work of breathing on room air Extremities: warm, well perfused, No edema, cyanosis or clubbing; +2 pulses bilaterally MSK: antalgic gait and station Skin: dry; intact; no rashes or lesions  Assessment/ Plan: 68 y.o. male   1. Essential hypertension Under excellent control.  Continue dual therapy.  Check BMP given initiation of ARB - Basic Metabolic Panel - amLODipine (NORVASC) 10 MG tablet; Take 1 tablet (10 mg total) by mouth daily.  Dispense: 90 tablet; Refill: 3 - losartan (COZAAR) 25 MG tablet; Take 1 tablet (25 mg total) by mouth daily.  Dispense: 90 tablet; Refill: 3  2. Mixed hyperlipidemia Check direct LDL.  Now  with new onset type 2 diabetes I will continue to keep him on the statin.  May need to increase for LDL goal of less than 70. - LDL Cholesterol, Direct - atorvastatin (LIPITOR) 40 MG tablet; Take 1 tablet (40 mg total) by mouth daily.  Dispense: 90 tablet; Refill: 3  3. Leukocytosis, unspecified type Uncertain etiology.  Repeat CBC - CBC  4. Elevated glucose level A1c was 7.7 today.  This indicates new onset type 2 diabetes.  For now, we will forego any medication and I will allow him to modify lifestyle.  We discussed diet modification and I have given them quite a bit of information with regards to carbohydrates today.  I offered referral to dietitian and they will contact me if they decide to pursue this.  He will see me back in 3 months for recheck sugar.  If persistently elevated, we will plan to start metformin - Basic Metabolic Panel - [facility] DCA Hb A1c Waived  5. New onset type 2 diabetes mellitus ([place]) As above   No orders of the defined types were placed in this encounter.  No orders of the defined types were placed in this encounter.    Janora Norlander, DO [place] [phone]

## 2019-03-09 NOTE — Patient Instructions (Signed)
Blood pressure looks great.  Keep taking the medications as directed.  See me in 6 months for interval check up if you can.  You had labs performed today.  You will be contacted with the results of the labs once they are available, usually in the next 3 business days for routine lab work.  If you had a pap smear or biopsy performed, expect to be contacted in about 7-10 days.   DASH Eating Plan DASH stands for "Dietary Approaches to Stop Hypertension." The DASH eating plan is a healthy eating plan that has been shown to reduce high blood pressure (hypertension). It may also reduce your risk for type 2 diabetes, heart disease, and stroke. The DASH eating plan may also help with weight loss. What are tips for following this plan?  General guidelines  Avoid eating more than 2,300 mg (milligrams) of salt (sodium) a day. If you have hypertension, you may need to reduce your sodium intake to 1,500 mg a day.  Limit alcohol intake to no more than 1 drink a day for nonpregnant women and 2 drinks a day for men. One drink equals 12 oz of beer, 5 oz of wine, or 1 oz of hard liquor.  Work with your health care provider to maintain a healthy body weight or to lose weight. Ask what an ideal weight is for you.  Get at least 30 minutes of exercise that causes your heart to beat faster (aerobic exercise) most days of the week. Activities may include walking, swimming, or biking.  Work with your health care provider or diet and nutrition specialist (dietitian) to adjust your eating plan to your individual calorie needs. Reading food labels   Check food labels for the amount of sodium per serving. Choose foods with less than 5 percent of the Daily Value of sodium. Generally, foods with less than 300 mg of sodium per serving fit into this eating plan.  To find whole grains, look for the word "whole" as the first word in the ingredient list. Shopping  Buy products labeled as "low-sodium" or "no salt added."   Buy fresh foods. Avoid canned foods and premade or frozen meals. Cooking  Avoid adding salt when cooking. Use salt-free seasonings or herbs instead of table salt or sea salt. Check with your health care provider or pharmacist before using salt substitutes.  Do not fry foods. Cook foods using healthy methods such as baking, boiling, grilling, and broiling instead.  Cook with heart-healthy oils, such as olive, canola, soybean, or sunflower oil. Meal planning  Eat a balanced diet that includes: ? 5 or more servings of fruits and vegetables each day. At each meal, try to fill half of your plate with fruits and vegetables. ? Up to 6-8 servings of whole grains each day. ? Less than 6 oz of lean meat, poultry, or fish each day. A 3-oz serving of meat is about the same size as a deck of cards. One egg equals 1 oz. ? 2 servings of low-fat dairy each day. ? A serving of nuts, seeds, or beans 5 times each week. ? Heart-healthy fats. Healthy fats called Omega-3 fatty acids are found in foods such as flaxseeds and coldwater fish, like sardines, salmon, and mackerel.  Limit how much you eat of the following: ? Canned or prepackaged foods. ? Food that is high in trans fat, such as fried foods. ? Food that is high in saturated fat, such as fatty meat. ? Sweets, desserts, sugary drinks, and other  foods with added sugar. ? Full-fat dairy products.  Do not salt foods before eating.  Try to eat at least 2 vegetarian meals each week.  Eat more home-cooked food and less restaurant, buffet, and fast food.  When eating at a restaurant, ask that your food be prepared with less salt or no salt, if possible. What foods are recommended? The items listed may not be a complete list. Talk with your dietitian about what dietary choices are best for you. Grains Whole-grain or whole-wheat bread. Whole-grain or whole-wheat pasta. Brown rice. [place]. Bulgur. Whole-grain and low-sodium cereals. Pita bread.  Low-fat, low-sodium crackers. Whole-wheat flour tortillas. Vegetables Fresh or frozen vegetables (raw, steamed, roasted, or grilled). Low-sodium or reduced-sodium tomato and vegetable juice. Low-sodium or reduced-sodium tomato sauce and tomato paste. Low-sodium or reduced-sodium canned vegetables. Fruits All fresh, dried, or frozen fruit. Canned fruit in natural juice (without added sugar). Meat and other protein foods Skinless chicken or [place]. Ground chicken or [place]. Pork with fat trimmed off. Fish and seafood. Egg whites. Dried beans, peas, or lentils. Unsalted nuts, nut butters, and seeds. Unsalted canned beans. Lean cuts of beef with fat trimmed off. Low-sodium, lean deli meat. Dairy Low-fat (1%) or fat-free (skim) milk. Fat-free, low-fat, or reduced-fat cheeses. Nonfat, low-sodium ricotta or cottage cheese. Low-fat or nonfat yogurt. Low-fat, low-sodium cheese. Fats and oils Soft margarine without trans fats. Vegetable oil. Low-fat, reduced-fat, or light mayonnaise and salad dressings (reduced-sodium). Canola, safflower, olive, soybean, and sunflower oils. Avocado. Seasoning and other foods Herbs. Spices. Seasoning mixes without salt. Unsalted popcorn and pretzels. Fat-free sweets. What foods are not recommended? The items listed may not be a complete list. Talk with your dietitian about what dietary choices are best for you. Grains Baked goods made with fat, such as croissants, muffins, or some breads. Dry pasta or rice meal packs. Vegetables Creamed or fried vegetables. Vegetables in a cheese sauce. Regular canned vegetables (not low-sodium or reduced-sodium). Regular canned tomato sauce and paste (not low-sodium or reduced-sodium). Regular tomato and vegetable juice (not low-sodium or reduced-sodium). Angie Fava. Olives. Fruits Canned fruit in a light or heavy syrup. Fried fruit. Fruit in cream or butter sauce. Meat and other protein foods Fatty cuts of meat. Ribs. Fried meat. Berniece Salines.  Sausage. Bologna and other processed lunch meats. Salami. Fatback. Hotdogs. Bratwurst. Salted nuts and seeds. Canned beans with added salt. Canned or smoked fish. Whole eggs or egg yolks. Chicken or [place] with skin. Dairy Whole or 2% milk, cream, and half-and-half. Whole or full-fat cream cheese. Whole-fat or sweetened yogurt. Full-fat cheese. Nondairy creamers. Whipped toppings. Processed cheese and cheese spreads. Fats and oils Butter. Stick margarine. Lard. Shortening. Ghee. Bacon fat. Tropical oils, such as coconut, palm kernel, or palm oil. Seasoning and other foods Salted popcorn and pretzels. Onion salt, garlic salt, seasoned salt, table salt, and sea salt. Worcestershire sauce. Tartar sauce. Barbecue sauce. Teriyaki sauce. Soy sauce, including reduced-sodium. Steak sauce. Canned and packaged gravies. Fish sauce. Oyster sauce. Cocktail sauce. Horseradish that you find on the shelf. Ketchup. Mustard. Meat flavorings and tenderizers. Bouillon cubes. Hot sauce and Tabasco sauce. Premade or packaged marinades. Premade or packaged taco seasonings. Relishes. Regular salad dressings. Where to find more information:  National Heart, Lung, and [place]: https://wilson-eaton.com/  American Heart Association: www.heart.org Summary  The DASH eating plan is a healthy eating plan that has been shown to reduce high blood pressure (hypertension). It may also reduce your risk for type 2 diabetes, heart disease, and stroke.  With  the DASH eating plan, you should limit salt (sodium) intake to 2,300 mg a day. If you have hypertension, you may need to reduce your sodium intake to 1,500 mg a day.  When on the DASH eating plan, aim to eat more fresh fruits and vegetables, whole grains, lean proteins, low-fat dairy, and heart-healthy fats.  Work with your health care provider or diet and nutrition specialist (dietitian) to adjust your eating plan to your individual calorie needs. This information is not intended  to replace advice given to you by your health care provider. Make sure you discuss any questions you have with your health care provider. Document Released: 11/15/2011 Document Revised: 11/19/2016 Document Reviewed: 11/19/2016 Elsevier Interactive Patient Education  2019 [employer].

## 2019-03-10 LAB — BASIC METABOLIC PANEL
BUN/Creatinine Ratio: 23 (ref 10–24)
BUN: 22 mg/dL (ref 8–27)
CO2: 25 mmol/L (ref 20–29)
Calcium: 9.3 mg/dL (ref 8.6–10.2)
Chloride: 99 mmol/L (ref 96–106)
Creatinine, Ser: 0.96 mg/dL (ref 0.76–1.27)
GFR calc Af Amer: 94 mL/min/{1.73_m2} (ref >59)
GFR calc non Af Amer: 81 mL/min/{1.73_m2} (ref >59)
Glucose: 152 mg/dL — ABNORMAL HIGH (ref 65–99)
Potassium: 4 mmol/L (ref 3.5–5.2)
Sodium: 139 mmol/L (ref 134–144)

## 2019-03-10 LAB — CBC
Hematocrit: 41.4 % (ref 37.5–51.0)
Hemoglobin: 14.3 g/dL (ref 13.0–17.7)
MCH: 30.1 pg (ref 26.6–33.0)
MCHC: 34.5 g/dL (ref 31.5–35.7)
MCV: 87 fL (ref 79–97)
Platelets: 271 10*3/uL (ref 150–450)
RBC: 4.75 x10E6/uL (ref 4.14–5.80)
RDW: 13.6 % (ref 11.6–15.4)
WBC: 9.3 10*3/uL (ref 3.4–10.8)

## 2019-03-10 LAB — LDL CHOLESTEROL, DIRECT: LDL Direct: 59 mg/dL (ref 0–99)

## 2019-05-21 DIAGNOSIS — Z024 Encounter for examination for driving license: Secondary | ICD-10-CM | POA: Diagnosis not present

## 2019-06-08 ENCOUNTER — Other Ambulatory Visit: Payer: Self-pay

## 2019-06-09 ENCOUNTER — Encounter: Payer: Self-pay | Admitting: Family Medicine

## 2019-06-09 ENCOUNTER — Other Ambulatory Visit: Payer: Self-pay

## 2019-06-09 ENCOUNTER — Ambulatory Visit (INDEPENDENT_AMBULATORY_CARE_PROVIDER_SITE_OTHER): Payer: BC Managed Care – PPO | Admitting: Family Medicine

## 2019-06-09 VITALS — BP 162/90 | HR 97 | Temp 97.8°F | Ht 68.0 in | Wt 220.0 lb

## 2019-06-09 DIAGNOSIS — E119 Type 2 diabetes mellitus without complications: Secondary | ICD-10-CM | POA: Diagnosis not present

## 2019-06-09 DIAGNOSIS — E782 Mixed hyperlipidemia: Secondary | ICD-10-CM | POA: Diagnosis not present

## 2019-06-09 DIAGNOSIS — I10 Essential (primary) hypertension: Secondary | ICD-10-CM

## 2019-06-09 LAB — BAYER DCA HB A1C WAIVED: HB A1C (BAYER DCA - WAIVED): 6.6 % (ref <7.0)

## 2019-06-09 MED ORDER — LOSARTAN POTASSIUM 25 MG PO TABS: 50.0000 mg | ORAL_TABLET | Freq: Every day | ORAL | 3 refills | Status: DC

## 2019-06-09 NOTE — Patient Instructions (Addendum)
Get your eye exam scheduled.  Sugar is controlled!  Great job.  Keep up the good work and stay away from sweets so we don't have to start medicines.  See me back in 3-6 months.

## 2019-06-09 NOTE — Progress Notes (Signed)
Subjective: CC: new onset DM2, HTN, HDL PCP: Gary Norlander, DO RPR:XYVOP Gary Lopez is a 68 y.o. male presenting to clinic today for:  1. Type 2 Diabetes w/ HTN, HLD:  Patient was seen 3 months ago and noted to have elevated blood sugar.  He had admitted to eating jellybeans and ice cream quite frequently.  He was motivated to reduce carbohydrates and therefore medication initiation was delayed to allow for lifestyle modification.  He follows up today and notes that he has cut back on carbs.  He is compliant with his amlodipine, Cozaar and atorvastatin.  Last eye exam: Needs Last foot exam: Needs Last A1c:  Lab Results  Component Value Date   HGBA1C 7.7 (H) 03/09/2019   Nephropathy screen indicated?:  On ARB Last flu, zoster and/or pneumovax:  Immunization History  Administered Date(s) Administered  . Pneumococcal Conjugate-13 11/24/2018  . Tdap 11/24/2018    ROS: Denies CP, SOB, leg edema.    Not on File Past Medical History:  Diagnosis Date  . Arthritis   . Hypertension     Current Outpatient Medications:  .  amLODipine (NORVASC) 10 MG tablet, Take 1 tablet (10 mg total) by mouth daily., Disp: 90 tablet, Rfl: 3 .  atorvastatin (LIPITOR) 40 MG tablet, Take 1 tablet (40 mg total) by mouth daily., Disp: 90 tablet, Rfl: 3 .  losartan (COZAAR) 25 MG tablet, Take 1 tablet (25 mg total) by mouth daily., Disp: 90 tablet, Rfl: 3 Social History   Socioeconomic History  . Marital status: Married    Spouse name: Not on file  . Number of children: Not on file  . Years of education: Not on file  . Highest education level: Not on file  Occupational History  . Occupation: [employer] operational officer  . Financial resource strain: Not on file  . Food insecurity    Worry: Not on file    Inability: Not on file  . Transportation needs    Medical: Not on file    Non-medical: Not on file  Tobacco Use  . Smoking status: Never Smoker  . Smokeless tobacco: Never Used  Substance  and Sexual Activity  . Alcohol use: Not Currently  . Drug use: Not Currently  . Sexual activity: Not Currently  Lifestyle  . Physical activity    Days per week: Not on file    Minutes per session: Not on file  . Stress: Not on file  Relationships  . Social Herbalist on phone: Not on file    Gets together: Not on file    Attends religious service: Not on file    Active member of club or organization: Not on file    Attends meetings of clubs or organizations: Not on file    Relationship status: Not on file  . Intimate partner violence    Fear of current or ex partner: Not on file    Emotionally abused: Not on file    Physically abused: Not on file    Forced sexual activity: Not on file  Other Topics Concern  . Not on file  Social History Narrative  . Not on file   Family History  Problem Relation Age of Onset  . Heart disease Mother   . COPD Father     Objective: Office vital signs reviewed. BP (!) 162/90   Pulse 97   Temp 97.8 F (36.6 C) (Oral)   Ht 5\' 8"  (1.727 m)   Wt 220 lb (  99.8 kg)   BMI 33.45 kg/m   Physical Examination:  General: Awake, alert, well nourished, No acute distress HEENT: Normal, sclera white, MMM Cardio: regular rate and rhythm, S1S2 heard, no murmurs appreciated Pulm: clear to auscultation bilaterally, no wheezes, rhonchi or rales; normal work of breathing on room air Extremities: warm, well perfused, No edema, cyanosis or clubbing; +2 pulses bilaterally Skin: dry; intact; no rashes or lesions Neuro: see below  Diabetic Foot Exam - Simple   Simple Foot Form Diabetic Foot exam was performed with the following findings: Yes 06/09/2019  5:11 PM  Visual Inspection No deformities, no ulcerations, no other skin breakdown bilaterally: Yes See comments: Yes Sensation Testing Intact to touch and monofilament testing bilaterally: Yes Pulse Check Posterior Tibialis and Dorsalis pulse intact bilaterally: Yes Comments Onychomycotic  changes to bilateral great toes noted.  He has thickened toes throughout.  Monofilament testing intact.  No skin breakdown are pre-ulcerative calluses.    Assessment/ Plan: 68 y.o. male   1. New onset type 2 diabetes mellitus ([place]) Now under control with diet alone.  Needs DM eye exam. - Bayer DCA Hb A1c Waived  2. Essential hypertension Not controlled. Increase losartan to 50mg  daily.  Return in 2 weeks for recheck.  BMP at that time. - losartan (COZAAR) 25 MG tablet; Take 2 tablets (50 mg total) by mouth daily.  Dispense: 90 tablet; Refill: 3  3. Mixed hyperlipidemia Continue statin.   Orders Placed This Encounter  Procedures  . Bayer DCA Hb A1c Waived   No orders of the defined types were placed in this encounter.    Gary Norlander, DO [place] [phone]

## 2019-06-10 ENCOUNTER — Telehealth: Payer: Self-pay | Admitting: Family Medicine

## 2019-06-10 NOTE — Telephone Encounter (Signed)
Form given to provider for signature.  ?

## 2019-06-10 NOTE — Telephone Encounter (Signed)
Form signed and placed up front - left detailed message

## 2019-06-16 ENCOUNTER — Ambulatory Visit: Payer: Self-pay | Admitting: Orthopedic Surgery

## 2019-06-16 NOTE — H&P (Signed)
TOTAL HIP ADMISSION H&P  Patient is admitted for left total hip arthroplasty.  Subjective:  Chief Complaint: left hip pain  HPI: Gary Lopez, 68 y.o. male, has a history of pain and functional disability in the left hip(s) due to arthritis and patient has failed non-surgical conservative treatments for greater than 12 weeks to include NSAID's and/or analgesics, corticosteriod injections, viscosupplementation injections, flexibility and strengthening excercises, use of assistive devices, weight reduction as appropriate and activity modification.  Onset of symptoms was gradual starting 4 years ago with gradually worsening course since that time.The patient noted no past surgery on the left hip(s).  Patient currently rates pain in the left hip at 4 out of 10 with activity. Patient has night pain, worsening of pain with activity and weight bearing, trendelenberg gait, pain that interfers with activities of daily living, pain with passive range of motion and joint swelling. Patient has evidence of subchondral cysts, subchondral sclerosis, periarticular osteophytes and joint space narrowing by imaging studies. This condition presents safety issues increasing the risk of falls.  There is no current active infection.  Patient Active Problem List   Diagnosis Date Noted  . New onset type 2 diabetes mellitus ([place]) 03/09/2019  . Mixed hyperlipidemia 12/08/2018  . Chronic left hip pain 11/24/2018  . Leg length discrepancy 11/24/2018  . Hypertension associated with diabetes ([place]) 11/24/2018   Past Medical History:  Diagnosis Date  . Arthritis   . Hypertension     No past surgical history on file.  Current Outpatient Medications  Medication Sig Dispense Refill Last Dose  . amLODipine (NORVASC) 10 MG tablet Take 1 tablet (10 mg total) by mouth daily. 90 tablet 3 Taking  . atorvastatin (LIPITOR) 40 MG tablet Take 1 tablet (40 mg total) by mouth daily. 90 tablet 3 Taking  . losartan (COZAAR) 25 MG tablet  Take 2 tablets (50 mg total) by mouth daily. 90 tablet 3    No current facility-administered medications for this visit.    Not on File  Social History   Tobacco Use  . Smoking status: Never Smoker  . Smokeless tobacco: Never Used  Substance Use Topics  . Alcohol use: Not Currently    Family History  Problem Relation Age of Onset  . Heart disease Mother   . COPD Father      Review of Systems  Constitutional: Negative.   HENT: Negative.   Eyes: Negative.   Respiratory: Negative.   Cardiovascular: Negative.   Gastrointestinal: Negative.   Genitourinary: Negative.   Musculoskeletal: Positive for joint pain.  Skin: Negative.   Neurological: Negative.   Endo/Heme/Allergies: Negative.   Psychiatric/Behavioral: Negative.     Objective:  Physical Exam  Vitals reviewed. Constitutional: He is oriented to person, place, and time. He appears well-developed and well-nourished.  HENT:  Head: Normocephalic and atraumatic.  Eyes: Pupils are equal, round, and reactive to light. Conjunctivae and EOM are normal.  Neck: Normal range of motion. Neck supple.  Cardiovascular: Normal rate, regular rhythm and intact distal pulses.  Respiratory: Effort normal. No respiratory distress.  GI: Soft. He exhibits no distension.  Genitourinary:    Genitourinary Comments: deferred   Musculoskeletal:     Left hip: He exhibits decreased range of motion, decreased strength and bony tenderness.  Neurological: He is alert and oriented to person, place, and time. He has normal reflexes.  Skin: Skin is warm and dry.  Psychiatric: He has a normal mood and affect. His behavior is normal. Judgment and thought content normal.  Vital signs in last 24 hours: @VSRANGES @  Labs:   Estimated body mass index is 33.45 kg/m as calculated from the following:   Height as of 06/09/19: 5\' 8"  (1.727 m).   Weight as of 06/09/19: 99.8 kg.   Imaging Review Plain radiographs demonstrate severe degenerative  joint disease of the left hip(s). The bone quality appears to be adequate for age and reported activity level.      Assessment/Plan:  End stage arthritis, left hip(s)  The patient history, physical examination, clinical judgement of the provider and imaging studies are consistent with end stage degenerative joint disease of the left hip(s) and total hip arthroplasty is deemed medically necessary. The treatment options including medical management, injection therapy, arthroscopy and arthroplasty were discussed at length. The risks and benefits of total hip arthroplasty were presented and reviewed. The risks due to aseptic loosening, infection, stiffness, dislocation/subluxation,  thromboembolic complications and other imponderables were discussed.  The patient acknowledged the explanation, agreed to proceed with the plan and consent was signed. Patient is being admitted for inpatient treatment for surgery, pain control, PT, OT, prophylactic antibiotics, VTE prophylaxis, progressive ambulation and ADL's and discharge planning.The patient is planning to be discharged home with HEP    Patient's anticipated LOS is less than 2 midnights, meeting these requirements: - Younger than 30 - Lives within 1 hour of care - Has a competent adult at home to recover with post-op recover - NO history of  - Chronic pain requiring opiods  - Diabetes  - Coronary Artery Disease  - Heart failure  - Heart attack  - Stroke  - DVT/VTE  - Cardiac arrhythmia  - Respiratory Failure/COPD  - Renal failure  - Anemia  - Advanced Liver disease

## 2019-06-22 ENCOUNTER — Other Ambulatory Visit: Payer: Self-pay

## 2019-06-22 ENCOUNTER — Other Ambulatory Visit (HOSPITAL_COMMUNITY)
Admission: RE | Admit: 2019-06-22 | Discharge: 2019-06-22 | Disposition: A | Discharge status: Home / Self Care | Payer: BC Managed Care – PPO | Source: Ambulatory Visit | Attending: Orthopedic Surgery | Admitting: Orthopedic Surgery

## 2019-06-22 DIAGNOSIS — Z1159 Encounter for screening for other viral diseases: Secondary | ICD-10-CM | POA: Diagnosis not present

## 2019-06-22 NOTE — Progress Notes (Signed)
NEED ORDERS FOR 7-16 SURGERY, PRE OP IS 7-15

## 2019-06-23 ENCOUNTER — Encounter: Payer: Self-pay | Admitting: Family Medicine

## 2019-06-23 ENCOUNTER — Other Ambulatory Visit (HOSPITAL_COMMUNITY): Payer: Self-pay | Admitting: *Deleted

## 2019-06-23 ENCOUNTER — Ambulatory Visit (INDEPENDENT_AMBULATORY_CARE_PROVIDER_SITE_OTHER): Payer: BC Managed Care – PPO | Admitting: Family Medicine

## 2019-06-23 ENCOUNTER — Other Ambulatory Visit: Payer: Self-pay

## 2019-06-23 VITALS — BP 147/79 | HR 91 | Temp 98.8°F | Ht 68.0 in | Wt 217.0 lb

## 2019-06-23 DIAGNOSIS — I10 Essential (primary) hypertension: Secondary | ICD-10-CM | POA: Diagnosis not present

## 2019-06-23 DIAGNOSIS — E1159 Type 2 diabetes mellitus with other circulatory complications: Secondary | ICD-10-CM | POA: Diagnosis not present

## 2019-06-23 LAB — SARS CORONAVIRUS 2 (TAT 6-24 HRS): SARS Coronavirus 2: NEGATIVE

## 2019-06-23 NOTE — Progress Notes (Signed)
LOV PCP DR Lajuana Ripple 06-23-19 Epic HEMAGLOBIN A1C 06-09-19 EPIC

## 2019-06-23 NOTE — Patient Instructions (Signed)
Continue amlodipine 10 mg daily and losartan 50 mg daily.  We discussed that once you are done with your 25 mg bottle I can increase this to the 50 mg so you do not have to take 2 tablets at a time anymore.  Call me when you are ready for a refill on that.  Blood pressure is looking better.  Ideal blood pressure is around 120/70 but your less than the goal of 150/90 for age.  Please asked that your kidney function be checked with your blood tests tomorrow for your preop appointment.  I have put an order for this if needed.

## 2019-06-23 NOTE — Patient Instructions (Addendum)
YOU HAD A COVID 19 TEST ON 06-22-2019. ONCE YOUR COVID TEST IS COMPLETED, PLEASE BEGIN THE QUARANTINE INSTRUCTIONS AS OUTLINED IN YOUR HANDOUT.                Latrelle Bazar    Your procedure is scheduled on: 06-25-2019  Report to [facility] Main  Entrance               Report to  [place] at  530 AM      Call this number if you have problems the morning of surgery [phone]    Remember: Do not eat food or drink liquids :After Midnight. BRUSH YOUR TEETH MORNING OF SURGERY AND RINSE YOUR MOUTH OUT, NO CHEWING GUM CANDY OR MINTS.     Take these medicines the morning of surgery with A SIP OF WATER: amlodipine                                You may not have any metal on your body including hair pins and              piercings  Do not wear jewelry, make-up, lotions, powders or perfumes, deodorant                        Men may shave face and neck.   Do not bring valuables to the hospital. [place].  Contacts, dentures or bridgework may not be worn into surgery. .  ___________________________________________________________________          [facility] - Preparing for Surgery Before surgery, you can play an important role.  Because skin is not sterile, your skin needs to be as free of germs as possible.  You can reduce the number of germs on your skin by washing with CHG (chlorahexidine gluconate) soap before surgery.  CHG is an antiseptic cleaner which kills germs and bonds with the skin to continue killing germs even after washing. Please DO NOT use if you have an allergy to CHG or antibacterial soaps.  If your skin becomes reddened/irritated stop using the CHG and inform your nurse when you arrive at Short Stay. Do not shave (including legs and underarms) for at least 48 hours prior to the first CHG shower.  You may shave your face/neck. Please follow these instructions carefully:  1.  Shower with CHG Soap the night  before surgery and the  morning of Surgery.  2.  If you choose to wash your hair, wash your hair first as usual with your  normal  shampoo.  3.  After you shampoo, rinse your hair and body thoroughly to remove the  shampoo.                           4.  Use CHG as you would any other liquid soap.  You can apply chg directly  to the skin and wash                       Gently with a scrungie or clean washcloth.  5.  Apply the CHG Soap to your body ONLY FROM THE NECK DOWN.   Do not use on face/ open  Wound or open sores. Avoid contact with eyes, ears mouth and genitals (private parts).                       Wash face,  Genitals (private parts) with your normal soap.             6.  Wash thoroughly, paying special attention to the area where your surgery  will be performed.  7.  Thoroughly rinse your body with warm water from the neck down.  8.  DO NOT shower/wash with your normal soap after using and rinsing off  the CHG Soap.                9.  Pat yourself dry with a clean towel.            10.  Wear clean pajamas.            11.  Place clean sheets on your bed the night of your first shower and do not  sleep with pets. Day of Surgery : Do not apply any lotions/deodorants the morning of surgery.  Please wear clean clothes to the hospital/surgery center.  FAILURE TO FOLLOW THESE INSTRUCTIONS MAY RESULT IN THE CANCELLATION OF YOUR SURGERY PATIENT SIGNATURE_________________________________  NURSE SIGNATURE__________________________________  ________________________________________________________________________

## 2019-06-23 NOTE — Progress Notes (Signed)
Subjective: CC:  F/u HTN PCP: Janora Norlander, DO ZDG:UYQIH Gary Lopez is a 68 y.o. male presenting to clinic today for:  1. Hypertension Patient was seen 2 weeks ago and blood pressures were noted to be elevated despite use of Norvasc and losartan.  His losartan was increased from 25 mg daily to 50 mg daily and he was instructed to follow-up today.  He notes that home blood pressures have been running 137-139/70s.  He thinks that his hip pain is a primary driver to his elevated blood pressures.  He has a surgery coming up soon for correction of this.  He notes that he has preop appointment tomorrow at which time he thinks he will have labs done.  Denies any chest pain, shortness of breath, lower extreme edema, dizziness or intolerance to increased dose of losartan.   No Known Allergies Past Medical History:  Diagnosis Date  . Arthritis   . Hypertension     Current Outpatient Medications:  .  acetaminophen (TYLENOL) 650 MG CR tablet, Take 1,300 mg by mouth every 8 (eight) hours as needed for pain., Disp: , Rfl:  .  amLODipine (NORVASC) 10 MG tablet, Take 1 tablet (10 mg total) by mouth daily., Disp: 90 tablet, Rfl: 3 .  atorvastatin (LIPITOR) 40 MG tablet, Take 1 tablet (40 mg total) by mouth daily., Disp: 90 tablet, Rfl: 3 .  losartan (COZAAR) 25 MG tablet, Take 2 tablets (50 mg total) by mouth daily., Disp: 90 tablet, Rfl: 3 Social History   Socioeconomic History  . Marital status: Married    Spouse name: Not on file  . Number of children: Not on file  . Years of education: Not on file  . Highest education level: Not on file  Occupational History  . Occupation: [employer] operational officer  . Financial resource strain: Not on file  . Food insecurity    Worry: Not on file    Inability: Not on file  . Transportation needs    Medical: Not on file    Non-medical: Not on file  Tobacco Use  . Smoking status: Never Smoker  . Smokeless tobacco: Never Used  Substance and Sexual  Activity  . Alcohol use: Not Currently  . Drug use: Not Currently  . Sexual activity: Not Currently  Lifestyle  . Physical activity    Days per week: Not on file    Minutes per session: Not on file  . Stress: Not on file  Relationships  . Social Herbalist on phone: Not on file    Gets together: Not on file    Attends religious service: Not on file    Active member of club or organization: Not on file    Attends meetings of clubs or organizations: Not on file    Relationship status: Not on file  . Intimate partner violence    Fear of current or ex partner: Not on file    Emotionally abused: Not on file    Physically abused: Not on file    Forced sexual activity: Not on file  Other Topics Concern  . Not on file  Social History Narrative  . Not on file   Family History  Problem Relation Age of Onset  . Heart disease Mother   . COPD Father     Objective: Office vital signs reviewed. BP (!) 147/79   Pulse 91   Temp 98.8 F (37.1 C) (Oral)   Ht 5\' 8"  (1.727 m)  Wt 217 lb (98.4 kg)   BMI 32.99 kg/m   Physical Examination:  General: Awake, alert, well nourished, No acute distress HEENT: Normal, sclera white, MMM Cardio: regular rate and rhythm, S1S2 heard, no murmurs appreciated Pulm: clear to auscultation bilaterally, no wheezes, rhonchi or rales; normal work of breathing on room air  Assessment/ Plan: 67 y.o. male   1. Hypertension associated with diabetes ([place]) Under better control than previous visit.  His home blood pressures are in the 130s over 70s.  Therefore no changes to medications today.  Continue losartan 50 mg daily and Norvasc 10 mg daily.  Instructed him to contact me when he is ready for refills of the losartan as we will change this to the 50 mg tablet.  Plan to follow-up after he has had his surgery for the hip. - Basic Metabolic Panel, Future   Orders Placed This Encounter  Procedures  . Basic Metabolic Panel    Standing Status:    Future    Standing Expiration Date:   06/22/2020   No orders of the defined types were placed in this encounter.    Janora Norlander, DO [place] [phone]

## 2019-06-24 ENCOUNTER — Other Ambulatory Visit: Payer: Self-pay

## 2019-06-24 ENCOUNTER — Encounter (HOSPITAL_COMMUNITY): Payer: Self-pay

## 2019-06-24 ENCOUNTER — Encounter (HOSPITAL_COMMUNITY): Payer: Self-pay | Admitting: Certified Registered"

## 2019-06-24 ENCOUNTER — Telehealth: Payer: Self-pay | Admitting: Family Medicine

## 2019-06-24 ENCOUNTER — Encounter (HOSPITAL_COMMUNITY)
Admission: RE | Admit: 2019-06-24 | Discharge: 2019-06-24 | Disposition: A | Discharge status: Home / Self Care | Payer: BC Managed Care – PPO | Source: Ambulatory Visit | Attending: Orthopedic Surgery | Admitting: Orthopedic Surgery

## 2019-06-24 DIAGNOSIS — E119 Type 2 diabetes mellitus without complications: Secondary | ICD-10-CM | POA: Diagnosis not present

## 2019-06-24 DIAGNOSIS — E782 Mixed hyperlipidemia: Secondary | ICD-10-CM | POA: Insufficient documentation

## 2019-06-24 DIAGNOSIS — I10 Essential (primary) hypertension: Secondary | ICD-10-CM | POA: Diagnosis not present

## 2019-06-24 DIAGNOSIS — Z01818 Encounter for other preprocedural examination: Secondary | ICD-10-CM | POA: Insufficient documentation

## 2019-06-24 DIAGNOSIS — Z79899 Other long term (current) drug therapy: Secondary | ICD-10-CM | POA: Diagnosis not present

## 2019-06-24 DIAGNOSIS — M1612 Unilateral primary osteoarthritis, left hip: Secondary | ICD-10-CM | POA: Insufficient documentation

## 2019-06-24 LAB — GLUCOSE, CAPILLARY: Glucose-Capillary: 111 mg/dL — ABNORMAL HIGH (ref 70–99)

## 2019-06-24 NOTE — Telephone Encounter (Signed)
Son states that during EKG today they found an abnormality. They would like you to review and advise what next step would be. They are postponing surgery until this is evaluated further.

## 2019-06-24 NOTE — Progress Notes (Signed)
Anesthesia Chart Review:  Pt seen today at presurgical testing for THA with Dr. Lyla Glassing scheduled for tomorrow 06/25/19.    Unfortunately pt has LBBB on EKG.  Pt denies hx of LBBB or ever having EKG in the past, so LBBB is new.  Pt will need to see cardiology prior to surgery. I notified Caryl Pina in Dr. Sid Falcon office.    BP (!) 144/87   Pulse 97   Temp 37.3 C (Oral)   Resp 18   Ht 5\' 8"  (1.727 m)   Wt 98 kg   SpO2 98%   BMI 32.84 kg/m    Gary Cass, FNP-BC [facility]/Anesthesiology Phone: [phone] 06/24/2019 2:44 PM

## 2019-06-25 ENCOUNTER — Ambulatory Visit (HOSPITAL_COMMUNITY)
Admission: RE | Admit: 2019-06-25 | Payer: BC Managed Care – PPO | Source: Home / Self Care | Admitting: Orthopedic Surgery

## 2019-06-25 ENCOUNTER — Encounter (HOSPITAL_COMMUNITY): Admission: RE | Payer: Self-pay | Source: Home / Self Care

## 2019-06-25 SURGERY — ARTHROPLASTY, HIP, TOTAL, ANTERIOR APPROACH
Anesthesia: Spinal | Laterality: Left

## 2019-06-26 ENCOUNTER — Other Ambulatory Visit: Payer: Self-pay | Admitting: Family Medicine

## 2019-06-26 DIAGNOSIS — R9431 Abnormal electrocardiogram [ECG] [EKG]: Secondary | ICD-10-CM

## 2019-06-26 DIAGNOSIS — I447 Left bundle-branch block, unspecified: Secondary | ICD-10-CM

## 2019-06-26 NOTE — Progress Notes (Signed)
Patient had a preop EKG obtained which demonstrated a left bundle branch block as well as some other electrical abnormalities on the ECG.  No previous history of cardiac disease but patient also did not see a doctor for many many years.  I am placing an urgent referral to cardiology.  He is currently asymptomatic.

## 2019-06-26 NOTE — Telephone Encounter (Signed)
Spoke to pt

## 2019-06-29 ENCOUNTER — Encounter: Payer: Self-pay | Admitting: *Deleted

## 2019-06-29 ENCOUNTER — Encounter: Payer: Self-pay | Admitting: Cardiology

## 2019-06-29 ENCOUNTER — Telehealth: Payer: Self-pay | Admitting: Cardiology

## 2019-06-29 ENCOUNTER — Ambulatory Visit (INDEPENDENT_AMBULATORY_CARE_PROVIDER_SITE_OTHER): Payer: BC Managed Care – PPO | Admitting: Cardiology

## 2019-06-29 ENCOUNTER — Other Ambulatory Visit: Payer: Self-pay

## 2019-06-29 VITALS — BP 154/72 | HR 93 | Temp 98.9°F | Ht 68.0 in | Wt 217.8 lb

## 2019-06-29 DIAGNOSIS — I10 Essential (primary) hypertension: Secondary | ICD-10-CM | POA: Diagnosis not present

## 2019-06-29 DIAGNOSIS — I447 Left bundle-branch block, unspecified: Secondary | ICD-10-CM | POA: Diagnosis not present

## 2019-06-29 DIAGNOSIS — E782 Mixed hyperlipidemia: Secondary | ICD-10-CM

## 2019-06-29 DIAGNOSIS — Z01810 Encounter for preprocedural cardiovascular examination: Secondary | ICD-10-CM | POA: Diagnosis not present

## 2019-06-29 DIAGNOSIS — E119 Type 2 diabetes mellitus without complications: Secondary | ICD-10-CM

## 2019-06-29 NOTE — Progress Notes (Signed)
Cardiology Office Note  Date: 06/29/2019   ID: Gary Lopez, Gary Lopez Apr 28, 1951, MRN [id number]  PCP:  Janora Norlander, DO  Consulting Cardiologist: Satira Sark, MD Electrophysiologist:  None   Chief Complaint  Patient presents with  . Cardiac evaluation    History of Present Illness: Gary Lopez is a 68 y.o. male referred for cardiology consultation by Dr. Lajuana Ripple for evaluation of recent abnormal ECG.  Based on chart review I see that the patient was scheduled to undergo left total hip arthroplasty with Dr. Lyla Glassing, however surgery was canceled in light of recent abnormal ECG.  Gary Lopez describes worsening left hip pain at least over the last year, this limits his activity significantly.  He can walk up a flight of steps very slowly but has to stop due to pain.  He does not report any exertional chest pain at low level activity or shortness of breath.  I personally reviewed the tracing from July which shows normal sinus rhythm with left bundle branch block, there is no old tracing for comparison.  He admits that he has not followed with a healthcare provider regularly over the years, but recently has established with PCP and is working on better blood pressure control.  He has a history of hypertension and also type 2 diabetes mellitus.  Past Medical History:  Diagnosis Date  . Arthritis   . History of pneumonia    20 years ago  . Hypertension   . Type 2 diabetes mellitus ([place])    Diet controlled    Past Surgical History:  Procedure Laterality Date  . right wrist surgery      Current Outpatient Medications  Medication Sig Dispense Refill  . acetaminophen (TYLENOL) 650 MG CR tablet Take 1,300 mg by mouth every 8 (eight) hours as needed for pain.    Marland Kitchen amLODipine (NORVASC) 10 MG tablet Take 1 tablet (10 mg total) by mouth daily. 90 tablet 3  . atorvastatin (LIPITOR) 40 MG tablet Take 1 tablet (40 mg total) by mouth daily. 90 tablet 3  . losartan  (COZAAR) 25 MG tablet Take 2 tablets (50 mg total) by mouth daily. 90 tablet 3   No current facility-administered medications for this visit.    Allergies:  Patient has no known allergies.   Social History: The patient  reports that he has never smoked. He has never used smokeless tobacco. He reports previous alcohol use. He reports previous drug use.   Family History: The patient's family history includes COPD in his father; Heart disease in his mother.   ROS:  Please see the history of present illness. Otherwise, complete review of systems is positive for left hip pain.  All other systems are reviewed and negative.   Physical Exam: VS:  BP (!) 154/72   Pulse 93   Temp 98.9 F (37.2 C)   Ht 5\' 8"  (1.727 m)   Wt 217 lb 12.8 oz (98.8 kg)   SpO2 98%   BMI 33.12 kg/m , BMI Body mass index is 33.12 kg/m.  Wt Readings from Last 3 Encounters:  06/29/19 217 lb 12.8 oz (98.8 kg)  06/24/19 216 lb (98 kg)  06/23/19 217 lb (98.4 kg)    General: Overweight male, appears comfortable at rest. HEENT: Conjunctiva and lids normal, oropharynx clear. Neck: Supple, no elevated JVP or carotid bruits, no thyromegaly. Lungs: Clear to auscultation, nonlabored breathing at rest. Cardiac: Regular rate and rhythm, no S3, 2/6 systolic murmur, no pericardial rub. Abdomen:  Soft, nontender, bowel sounds present. Extremities: No pitting edema, distal pulses 2+. Skin: Warm and dry. Musculoskeletal: No kyphosis. Neuropsychiatric: Alert and oriented x3, affect grossly appropriate.  ECG:  An ECG dated 06/24/2019 was personally reviewed today and demonstrated:  Normal sinus rhythm with left bundle branch block.  No old tracing available for comparison.  Recent Labwork: 11/24/2018: ALT 35; AST 30; TSH 1.890 03/09/2019: BUN 22; Creatinine, Ser 0.96; Hemoglobin 14.3; Platelets 271; Potassium 4.0; Sodium 139     Component Value Date/Time   CHOL 189 11/24/2018 1652   TRIG 117 11/24/2018 1652   HDL 33 (L)  11/24/2018 1652   CHOLHDL 5.7 (H) 11/24/2018 1652   LDLCALC 133 (H) 11/24/2018 1652   LDLDIRECT 59 03/09/2019 1629    Other Studies Reviewed Today:  No prior cardiac testing for review.  Assessment and Plan:  1.  Preoperative cardiac evaluation in a 68 year old male with hypertension, mixed hyperlipidemia and type 2 diabetes mellitus, ECG showing left bundle branch block of uncertain duration, and plan for left hip arthroplasty under general anesthesia.  He does not report any obvious angina symptoms or history of heart failure, although activity is low level at this point due to left hip pain.  Plan is to obtain an echocardiogram for cardiac structural assessment as well as a Lexiscan Myoview for ischemic evaluation.  Further recommendations to follow.  2.  Essential hypertension, currently on Norvasc and Cozaar with follow-up by PCP.  3.  Reported diet managed type 2 diabetes mellitus.  4.  Mixed hyperlipidemia, currently on Lipitor with recent LDL 59.  Medication Adjustments/Labs and Tests Ordered: Current medicines are reviewed at length with the patient today.  Concerns regarding medicines are outlined above.   Tests Ordered: Orders Placed This Encounter  Procedures  . NM Myocar Multi W/Spect W/Wall Motion / EF  . ECHOCARDIOGRAM COMPLETE    Medication Changes: No orders of the defined types were placed in this encounter.   Disposition:  Follow up test results.  Signed, Satira Sark, MD, [facility] 06/29/2019 2:23 PM    [facility] [address], [place], [place] [address] Phone: [phone]; Fax: [phone]

## 2019-06-29 NOTE — Telephone Encounter (Signed)
Pre-cert Verification for the following procedure    Lexiscan scheduled for 07/01/2019 at [facility]  Echo scheduled for 07/01/2019 at [facility]

## 2019-06-29 NOTE — Patient Instructions (Signed)
Your physician recommends that you schedule a follow-up appointment in: PENDING TEST RESULTS   Your physician recommends that you continue on your current medications as directed. Please refer to the Current Medication list given to you today.  Your physician has requested that you have an echocardiogram. Echocardiography is a painless test that uses sound waves to create images of your heart. It provides your doctor with information about the size and shape of your heart and how well your heart's chambers and valves are working. This procedure takes approximately one hour. There are no restrictions for this procedure.  Your physician has requested that you have a lexiscan myoview. For further information please visit [email or web address]. Please follow instruction sheet, as given.  Thank you for choosing [facility]!!

## 2019-07-01 ENCOUNTER — Encounter (HOSPITAL_COMMUNITY): Payer: Medicare Other

## 2019-07-01 ENCOUNTER — Other Ambulatory Visit: Payer: Self-pay

## 2019-07-01 ENCOUNTER — Other Ambulatory Visit (HOSPITAL_COMMUNITY): Payer: Medicare Other

## 2019-07-01 ENCOUNTER — Ambulatory Visit (INDEPENDENT_AMBULATORY_CARE_PROVIDER_SITE_OTHER): Payer: BC Managed Care – PPO

## 2019-07-01 ENCOUNTER — Telehealth: Payer: Self-pay | Admitting: Cardiology

## 2019-07-01 ENCOUNTER — Ambulatory Visit (HOSPITAL_COMMUNITY): Payer: BC Managed Care – PPO

## 2019-07-01 DIAGNOSIS — I447 Left bundle-branch block, unspecified: Secondary | ICD-10-CM

## 2019-07-01 DIAGNOSIS — Z01810 Encounter for preprocedural cardiovascular examination: Secondary | ICD-10-CM

## 2019-07-01 NOTE — Telephone Encounter (Signed)
Received notification from [facility] Chief Financial Officer) Stress test ordered is still pending they said it didnt meet criteria to be done expedited .They have until 8/5 to approve) will forward to Dr. Domenic Polite

## 2019-07-02 ENCOUNTER — Ambulatory Visit (HOSPITAL_COMMUNITY): Payer: BC Managed Care – PPO

## 2019-07-02 ENCOUNTER — Encounter (HOSPITAL_COMMUNITY): Payer: Medicare Other

## 2019-07-02 NOTE — Telephone Encounter (Signed)
I am not exactly certain what you would like me to do with this.  I just saw the patient in the office for preoperative evaluation.  He has at least intermediate pretest probability of CAD and an abnormal ECG showing left bundle branch block.  Left hip surgery was already canceled pending further cardiac evaluation.  It would seem to me that getting this test done sooner rather than later would make sense and help move along his operative plans.

## 2019-07-06 ENCOUNTER — Telehealth: Payer: Self-pay | Admitting: *Deleted

## 2019-07-06 NOTE — Telephone Encounter (Signed)
-----   Message from Satira Sark, MD sent at 07/03/2019 12:07 PM EDT ----- Results reviewed.  LVEF is normal at 55%, septal motion consistent with left bundle branch block which is evident by ECG.  He does have mild to moderate aortic valve stenosis which would correspond with his heart murmur, but this is unlikely to be clinically significant at this point or represent a significant perioperative surgical risk.  Await results of Myoview.

## 2019-07-07 NOTE — Telephone Encounter (Signed)
Pt voiced understanding - awaiting approval for stress test from insurance - routed to pcp

## 2019-07-15 ENCOUNTER — Telehealth: Payer: Self-pay | Admitting: *Deleted

## 2019-07-15 NOTE — Telephone Encounter (Signed)
Checking on status-pending approval from insurance.

## 2019-07-23 NOTE — Telephone Encounter (Signed)
I was finally able to get this approved.  Can someone please reschedule him?  Thank you!

## 2019-07-24 ENCOUNTER — Telehealth: Payer: Self-pay | Admitting: *Deleted

## 2019-07-24 DIAGNOSIS — H04123 Dry eye syndrome of bilateral lacrimal glands: Secondary | ICD-10-CM | POA: Diagnosis not present

## 2019-07-24 DIAGNOSIS — H40033 Anatomical narrow angle, bilateral: Secondary | ICD-10-CM | POA: Diagnosis not present

## 2019-07-24 NOTE — Telephone Encounter (Signed)
Received letter on Saturday saying insurance approved stress test precert.

## 2019-07-29 ENCOUNTER — Telehealth: Payer: Self-pay | Admitting: Cardiology

## 2019-07-29 NOTE — Telephone Encounter (Signed)
Left message for patient to return call in regards to scheduling his Stress test.  Received authorization from [facility] # [id number]

## 2019-07-30 ENCOUNTER — Telehealth: Payer: Self-pay | Admitting: Cardiology

## 2019-07-30 NOTE — Telephone Encounter (Signed)
°  Precert needed for: Lexiscan   Location: [facility]     Date: Aug 04, 2019

## 2019-08-03 ENCOUNTER — Other Ambulatory Visit: Payer: Self-pay | Admitting: Family Medicine

## 2019-08-03 DIAGNOSIS — I10 Essential (primary) hypertension: Secondary | ICD-10-CM

## 2019-08-03 MED ORDER — LOSARTAN POTASSIUM 50 MG PO TABS: 50.0000 mg | ORAL_TABLET | Freq: Every day | ORAL | 3 refills | Status: DC

## 2019-08-03 NOTE — Telephone Encounter (Signed)
RX sent in for Losartan 50mg  per DR Lajuana Ripple OV from 06/23/2019

## 2019-08-04 ENCOUNTER — Encounter: Payer: Self-pay | Admitting: *Deleted

## 2019-08-04 ENCOUNTER — Ambulatory Visit (HOSPITAL_COMMUNITY)
Admission: RE | Admit: 2019-08-04 | Discharge: 2019-08-04 | Disposition: A | Discharge status: Home / Self Care | Payer: BC Managed Care – PPO | Source: Ambulatory Visit | Attending: Cardiology | Admitting: Cardiology

## 2019-08-04 ENCOUNTER — Other Ambulatory Visit: Payer: Self-pay

## 2019-08-04 ENCOUNTER — Telehealth: Payer: Self-pay | Admitting: *Deleted

## 2019-08-04 ENCOUNTER — Encounter (HOSPITAL_COMMUNITY)
Admission: RE | Admit: 2019-08-04 | Discharge: 2019-08-04 | Disposition: A | Discharge status: Home / Self Care | Payer: BC Managed Care – PPO | Source: Ambulatory Visit | Attending: Cardiology | Admitting: Cardiology

## 2019-08-04 DIAGNOSIS — I447 Left bundle-branch block, unspecified: Secondary | ICD-10-CM

## 2019-08-04 DIAGNOSIS — Z01810 Encounter for preprocedural cardiovascular examination: Secondary | ICD-10-CM | POA: Diagnosis not present

## 2019-08-04 LAB — NM MYOCAR MULTI W/SPECT W/WALL MOTION / EF
LV dias vol: 107 mL (ref 62–150)
LV sys vol: 66 mL
Peak HR: 110 {beats}/min
RATE: 0.35
Rest HR: 87 {beats}/min
SDS: 2
SRS: 7
SSS: 9
TID: 1.12

## 2019-08-04 MED ORDER — TECHNETIUM TC 99M TETROFOSMIN IV KIT
30.0000 | PACK | Freq: Once | INTRAVENOUS | Status: AC | PRN
  Administered 2019-08-04: 11:00:00 31.5 via INTRAVENOUS

## 2019-08-04 MED ORDER — REGADENOSON 0.4 MG/5ML IV SOLN
INTRAVENOUS | Status: AC
  Administered 2019-08-04: 0.4 mg via INTRAVENOUS
  Filled 2019-08-04: qty 5

## 2019-08-04 MED ORDER — SODIUM CHLORIDE 0.9% FLUSH
INTRAVENOUS | Status: AC
  Administered 2019-08-04: 10 mL via INTRAVENOUS
  Filled 2019-08-04: qty 10

## 2019-08-04 MED ORDER — TECHNETIUM TC 99M TETROFOSMIN IV KIT
10.0000 | PACK | Freq: Once | INTRAVENOUS | Status: AC | PRN
  Administered 2019-08-04: 09:00:00 11 via INTRAVENOUS

## 2019-08-04 NOTE — Telephone Encounter (Signed)
-----   Message from Satira Sark, MD sent at 08/04/2019  1:10 PM EDT ----- Results reviewed.  I also reviewed the study images.  Inferior defect looks to be due to artifact/soft tissue attenuation and not clearly ischemic to suggest obstructive CAD.  The reduced ejection fraction also looks to be inaccurate as recent echocardiogram demonstrated normal LVEF of 55%.  This would suggest that he should be able to proceed with planned surgery at an acceptable perioperative cardiac risk.  I can complete clearance documentation when available.

## 2019-08-04 NOTE — Telephone Encounter (Signed)
Patient informed. Copy sent to PCP and Dr. Lyla Glassing.

## 2019-08-18 ENCOUNTER — Ambulatory Visit: Payer: Self-pay | Admitting: Orthopedic Surgery

## 2019-08-21 ENCOUNTER — Other Ambulatory Visit (HOSPITAL_COMMUNITY): Payer: Self-pay | Admitting: *Deleted

## 2019-08-21 ENCOUNTER — Encounter (HOSPITAL_COMMUNITY): Payer: Self-pay

## 2019-08-21 NOTE — Patient Instructions (Addendum)
DUE TO COVID-19 ONLY ONE VISITOR IS ALLOWED TO COME WITH YOU AND STAY IN THE WAITING ROOM ONLY DURING PRE OP AND PROCEDURE DAY OF SURGERY. THE 1 VISITOR MAY VISIT WITH YOU AFTER SURGERY IN YOUR PRIVATE ROOM DURING VISITING HOURS ONLY!  YOU HAD A COVID  19 TEST ON_9-14-2020 . NCE YOUR COVID TEST IS COMPLETED, PLEASE BEGIN THE QUARANTINE INSTRUCTIONS AS OUTLINED IN YOUR HANDOUT.                Gary Lopez    Your procedure is scheduled on: 08-27-2019   Report to [facility] Main  Entrance   Report to  [place] at 530  AM     Call this number if you have problems the morning of surgery [phone]    Remember:. BRUSH YOUR TEETH MORNING OF SURGERY AND RINSE YOUR MOUTH OUT, NO CHEWING GUM CANDY OR MINTS.   NO SOLID FOOD AFTER MIDNIGHT THE NIGHT PRIOR TO SURGERY. NOTHING BY MOUTH EXCEPT CLEAR LIQUIDS UNTIL 430 AM.    PLEASE FINISH G 2  DRINK PER SURGEON ORDER  WHICH NEEDS TO BE COMPLETED AT  430 AM.   CLEAR LIQUID DIET   Foods Allowed                                                                     Foods Excluded  Coffee and tea, regular and decaf                             liquids that you cannot  Plain Jell-O any favor except red or purple                                           see through such as: Fruit ices (not with fruit pulp)                                     milk, soups, orange juice  Iced Popsicles                                    All solid food Carbonated beverages, regular and diet                                    Cranberry, grape and apple juices Sports drinks like Gatorade Lightly seasoned clear broth or consume(fat free) Sugar, honey syrup  Sample Menu Breakfast                                Lunch                                     Supper Cranberry juice  Beef broth                            Chicken broth Jell-O                                     Grape juice                           Apple juice Coffee or tea                         Jell-O                                      Popsicle                                                Coffee or tea                        Coffee or tea  _____________________________________________________________________     Take these medicines the morning of surgery with A SIP OF WATER: AMLODIPINE, ATORVASTATIN (LIPITOR)                                You may not have any metal on your body including hair pins and              piercings  Do not wear jewelry, make-up, lotions, powders or perfumes, deodorant                          Men may shave face and neck.   Do not bring valuables to the hospital. [place].  Contacts, dentures or bridgework may not be worn into surgery.  Leave suitcase in the car. After surgery it may be brought to your room.                  Please read over the following fact sheets you were given: _____________________________________________________________________             [facility] - Preparing for Surgery Before surgery, you can play an important role.  Because skin is not sterile, your skin needs to be as free of germs as possible.  You can reduce the number of germs on your skin by washing with CHG (chlorahexidine gluconate) soap before surgery.  CHG is an antiseptic cleaner which kills germs and bonds with the skin to continue killing germs even after washing. Please DO NOT use if you have an allergy to CHG or antibacterial soaps.  If your skin becomes reddened/irritated stop using the CHG and inform your nurse when you arrive at Short Stay. Do not shave (including legs and underarms) for at least 48 hours prior to the first CHG shower.  You may shave your face/neck. Please follow these instructions carefully:  1.  Shower with CHG Soap the  night before surgery and the  morning of Surgery.  2.  If you choose to wash your hair, wash your hair first as usual with your  normal   shampoo.  3.  After you shampoo, rinse your hair and body thoroughly to remove the  shampoo.                           4.  Use CHG as you would any other liquid soap.  You can apply chg directly  to the skin and wash                       Gently with a scrungie or clean washcloth.  5.  Apply the CHG Soap to your body ONLY FROM THE NECK DOWN.   Do not use on face/ open                           Wound or open sores. Avoid contact with eyes, ears mouth and genitals (private parts).                       Wash face,  Genitals (private parts) with your normal soap.             6.  Wash thoroughly, paying special attention to the area where your surgery  will be performed.  7.  Thoroughly rinse your body with warm water from the neck down.  8.  DO NOT shower/wash with your normal soap after using and rinsing off  the CHG Soap.                9.  Pat yourself dry with a clean towel.            10.  Wear clean pajamas.            11.  Place clean sheets on your bed the night of your first shower and do not  sleep with pets. Day of Surgery : Do not apply any lotions/deodorants the morning of surgery.  Please wear clean clothes to the hospital/surgery center.  FAILURE TO FOLLOW THESE INSTRUCTIONS MAY RESULT IN THE CANCELLATION OF YOUR SURGERY PATIENT SIGNATURE_________________________________  NURSE SIGNATURE__________________________________  ________________________________________________________________________   Gary Lopez  An incentive spirometer is a tool that can help keep your lungs clear and active. This tool measures how well you are filling your lungs with each breath. Taking long deep breaths may help reverse or decrease the chance of developing breathing (pulmonary) problems (especially infection) following:  A long period of time when you are unable to move or be active. BEFORE THE PROCEDURE   If the spirometer includes an indicator to show your best effort, your nurse or  respiratory therapist will set it to a desired goal.  If possible, sit up straight or lean slightly forward. Try not to slouch.  Hold the incentive spirometer in an upright position. INSTRUCTIONS FOR USE  1. Sit on the edge of your bed if possible, or sit up as far as you can in bed or on a chair. 2. Hold the incentive spirometer in an upright position. 3. Breathe out normally. 4. Place the mouthpiece in your mouth and seal your lips tightly around it. 5. Breathe in slowly and as deeply as possible, raising the piston or the ball toward the top of the column. 6. Hold your breath for 3-5  seconds or for as long as possible. Allow the piston or ball to fall to the bottom of the column. 7. Remove the mouthpiece from your mouth and breathe out normally. 8. Rest for a few seconds and repeat Steps 1 through 7 at least 10 times every 1-2 hours when you are awake. Take your time and take a few normal breaths between deep breaths. 9. The spirometer may include an indicator to show your best effort. Use the indicator as a goal to work toward during each repetition. 10. After each set of 10 deep breaths, practice coughing to be sure your lungs are clear. If you have an incision (the cut made at the time of surgery), support your incision when coughing by placing a pillow or rolled up towels firmly against it. Once you are able to get out of bed, walk around indoors and cough well. You may stop using the incentive spirometer when instructed by your caregiver.  RISKS AND COMPLICATIONS  Take your time so you do not get dizzy or light-headed.  If you are in pain, you may need to take or ask for pain medication before doing incentive spirometry. It is harder to take a deep breath if you are having pain. AFTER USE  Rest and breathe slowly and easily.  It can be helpful to keep track of a log of your progress. Your caregiver can provide you with a simple table to help with this. If you are using the  spirometer at home, follow these instructions: Reading IF:   You are having difficultly using the spirometer.  You have trouble using the spirometer as often as instructed.  Your pain medication is not giving enough relief while using the spirometer.  You develop fever of 100.5 F (38.1 C) or higher. SEEK IMMEDIATE MEDICAL CARE IF:   You cough up bloody sputum that had not been present before.  You develop fever of 102 F (38.9 C) or greater.  You develop worsening pain at or near the incision site. MAKE SURE YOU:   Understand these instructions.  Will watch your condition.  Will get help right away if you are not doing well or get worse. Document Released: 04/08/2007 Document Revised: 02/18/2012 Document Reviewed: 06/09/2007 Exit[employer] Patient Information 2014 Exit[employer], [place].   ________________________________________________________________________  WHAT IS A BLOOD TRANSFUSION? Blood Transfusion Information  A transfusion is the replacement of blood or some of its parts. Blood is made up of multiple cells which provide different functions.  Red blood cells carry oxygen and are used for blood loss replacement.  White blood cells fight against infection.  Platelets control bleeding.  Plasma helps clot blood.  Other blood products are available for specialized needs, such as hemophilia or other clotting disorders. BEFORE THE TRANSFUSION  Who gives blood for transfusions?   Healthy volunteers who are fully evaluated to make sure their blood is safe. This is blood bank blood. Transfusion therapy is the safest it has ever been in the practice of medicine. Before blood is taken from a donor, a complete history is taken to make sure that person has no history of diseases nor engages in risky social behavior (examples are intravenous drug use or sexual activity with multiple partners). The donor's travel history is screened to minimize risk of transmitting  infections, such as malaria. The donated blood is tested for signs of infectious diseases, such as HIV and hepatitis. The blood is then tested to be sure it is compatible with you in order  to minimize the chance of a transfusion reaction. If you or a relative donates blood, this is often done in anticipation of surgery and is not appropriate for emergency situations. It takes many days to process the donated blood. RISKS AND COMPLICATIONS Although transfusion therapy is very safe and saves many lives, the main dangers of transfusion include:   Getting an infectious disease.  Developing a transfusion reaction. This is an allergic reaction to something in the blood you were given. Every precaution is taken to prevent this. The decision to have a blood transfusion has been considered carefully by your caregiver before blood is given. Blood is not given unless the benefits outweigh the risks. AFTER THE TRANSFUSION  Right after receiving a blood transfusion, you will usually feel much better and more energetic. This is especially true if your red blood cells have gotten low (anemic). The transfusion raises the level of the red blood cells which carry oxygen, and this usually causes an energy increase.  The nurse administering the transfusion will monitor you carefully for complications. HOME CARE INSTRUCTIONS  No special instructions are needed after a transfusion. You may find your energy is better. Speak with your caregiver about any limitations on activity for underlying diseases you may have. SEEK MEDICAL CARE IF:   Your condition is not improving after your transfusion.  You develop redness or irritation at the intravenous (IV) site. SEEK IMMEDIATE MEDICAL CARE IF:  Any of the following symptoms occur over the next 12 hours:  Shaking chills.  You have a temperature by mouth above 102 F (38.9 C), not controlled by medicine.  Chest, back, or muscle pain.  People around you feel you are  not acting correctly or are confused.  Shortness of breath or difficulty breathing.  Dizziness and fainting.  You get a rash or develop hives.  You have a decrease in urine output.  Your urine turns a dark color or changes to pink, red, or brown. Any of the following symptoms occur over the next 10 days:  You have a temperature by mouth above 102 F (38.9 C), not controlled by medicine.  Shortness of breath.  Weakness after normal activity.  The white part of the eye turns yellow (jaundice).  You have a decrease in the amount of urine or are urinating less often.  Your urine turns a dark color or changes to pink, red, or brown. Document Released: 11/23/2000 Document Revised: 02/18/2012 Document Reviewed: 07/12/2008 [facility] Patient Information 2014 [place], [place].  _______________________________________________________________________

## 2019-08-24 ENCOUNTER — Other Ambulatory Visit (HOSPITAL_COMMUNITY)
Admission: RE | Admit: 2019-08-24 | Discharge: 2019-08-24 | Disposition: A | Discharge status: Home / Self Care | Payer: BC Managed Care – PPO | Source: Ambulatory Visit | Attending: Orthopedic Surgery | Admitting: Orthopedic Surgery

## 2019-08-24 DIAGNOSIS — Z20828 Contact with and (suspected) exposure to other viral communicable diseases: Secondary | ICD-10-CM | POA: Insufficient documentation

## 2019-08-24 DIAGNOSIS — Z01812 Encounter for preprocedural laboratory examination: Secondary | ICD-10-CM | POA: Insufficient documentation

## 2019-08-24 DIAGNOSIS — M1612 Unilateral primary osteoarthritis, left hip: Secondary | ICD-10-CM | POA: Insufficient documentation

## 2019-08-25 LAB — NOVEL CORONAVIRUS, NAA (HOSP ORDER, SEND-OUT TO REF LAB; TAT 18-24 HRS): SARS-CoV-2, NAA: NOT DETECTED

## 2019-08-26 ENCOUNTER — Encounter (HOSPITAL_COMMUNITY): Payer: Self-pay

## 2019-08-26 ENCOUNTER — Other Ambulatory Visit: Payer: Self-pay

## 2019-08-26 ENCOUNTER — Encounter (HOSPITAL_COMMUNITY)
Admission: RE | Admit: 2019-08-26 | Discharge: 2019-08-26 | Disposition: A | Discharge status: Home / Self Care | Payer: BC Managed Care – PPO | Source: Ambulatory Visit | Attending: Orthopedic Surgery | Admitting: Orthopedic Surgery

## 2019-08-26 DIAGNOSIS — M1612 Unilateral primary osteoarthritis, left hip: Secondary | ICD-10-CM | POA: Insufficient documentation

## 2019-08-26 DIAGNOSIS — Z01812 Encounter for preprocedural laboratory examination: Secondary | ICD-10-CM | POA: Insufficient documentation

## 2019-08-26 HISTORY — DX: Abnormal electrocardiogram (ECG) (EKG): R94.31

## 2019-08-26 HISTORY — DX: Cardiac murmur, unspecified: R01.1

## 2019-08-26 LAB — COMPREHENSIVE METABOLIC PANEL
ALT: 71 U/L — ABNORMAL HIGH (ref 0–44)
AST: 40 U/L (ref 15–41)
Albumin: 4.5 g/dL (ref 3.5–5.0)
Alkaline Phosphatase: 71 U/L (ref 38–126)
Anion gap: 11 (ref 5–15)
BUN: 18 mg/dL (ref 8–23)
CO2: 24 mmol/L (ref 22–32)
Calcium: 9.5 mg/dL (ref 8.9–10.3)
Chloride: 102 mmol/L (ref 98–111)
Creatinine, Ser: 0.8 mg/dL (ref 0.61–1.24)
GFR calc Af Amer: >60 mL/min (ref >60)
GFR calc non Af Amer: >60 mL/min (ref >60)
Glucose, Bld: 150 mg/dL — ABNORMAL HIGH (ref 70–99)
Potassium: 4.1 mmol/L (ref 3.5–5.1)
Sodium: 137 mmol/L (ref 135–145)
Total Bilirubin: 1 mg/dL (ref 0.3–1.2)
Total Protein: 8.1 g/dL (ref 6.5–8.1)

## 2019-08-26 LAB — URINALYSIS, ROUTINE W REFLEX MICROSCOPIC
Bilirubin Urine: NEGATIVE
Glucose, UA: NEGATIVE mg/dL
Hgb urine dipstick: NEGATIVE
Ketones, ur: NEGATIVE mg/dL
Leukocytes,Ua: NEGATIVE
Nitrite: NEGATIVE
Protein, ur: NEGATIVE mg/dL
Specific Gravity, Urine: 1.02 (ref 1.005–1.030)
pH: 6.5 (ref 5.0–8.0)

## 2019-08-26 LAB — SURGICAL PCR SCREEN
MRSA, PCR: NEGATIVE
Staphylococcus aureus: NEGATIVE

## 2019-08-26 LAB — CBC
HCT: 45.1 % (ref 39.0–52.0)
Hemoglobin: 15.6 g/dL (ref 13.0–17.0)
MCH: 30.4 pg (ref 26.0–34.0)
MCHC: 34.6 g/dL (ref 30.0–36.0)
MCV: 87.7 fL (ref 80.0–100.0)
Platelets: 245 10*3/uL (ref 150–400)
RBC: 5.14 MIL/uL (ref 4.22–5.81)
RDW: 13.1 % (ref 11.5–15.5)
WBC: 8.6 10*3/uL (ref 4.0–10.5)
nRBC: 0 % (ref 0.0–0.2)

## 2019-08-26 LAB — ABO/RH: ABO/RH(D): O POS

## 2019-08-26 LAB — GLUCOSE, CAPILLARY: Glucose-Capillary: 163 mg/dL — ABNORMAL HIGH (ref 70–99)

## 2019-08-26 NOTE — Progress Notes (Signed)
Anesthesia Chart Review   Case: [id number] Date/Time: 08/27/19 0715   Procedure: TOTAL HIP ARTHROPLASTY ANTERIOR APPROACH (Left )   Anesthesia type: Spinal   Pre-op diagnosis: Degenerative Joint disease left hip   Location: [facility] ROOM 08 / [facility]   Surgeon: Rod Can, MD      DISCUSSION:68 y.o. 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 Rod Can.    Previously scheduled for hip surgery 06/25/2019, found to have new LBBB at PAT visit and advised to be seen by cardiology.   Seen by cardiologist, Dr. Rozann Lesches, 06/29/2019.  Echo and Lexiscan ordered.  Per Dr. Domenic Polite 08/04/2019 in regards to [facility] results, "Results reviewed.  I also reviewed the study images.  Inferior defect looks to be due to artifact/soft tissue attenuation and not clearly ischemic to suggest obstructive CAD.  The reduced ejection fraction also looks to be inaccurate as recent echocardiogram demonstrated normal LVEF of 55%.  This would suggest that he should be able to proceed with planned surgery at an acceptable perioperative cardiac risk."  Anticipate pt can proceed with planned procedure barring acute status change.   VS: BP (!) 160/81   Pulse 89   Temp 36.8 C (Oral)   Resp 18   Ht 5\' 8"  (1.727 m)   Wt 100.9 kg   SpO2 98%   BMI 33.82 kg/m   PROVIDERS: Janora Norlander, DO is PCP   Rozann Lesches, MD is Cardiologist   LABS: Labs reviewed: Acceptable for surgery. (all labs ordered are listed, but only abnormal results are displayed)  Labs Reviewed  GLUCOSE, CAPILLARY - Abnormal; Notable for the following components:      Result Value   Glucose-Capillary 163 (*)    All other components within normal limits  COMPREHENSIVE METABOLIC PANEL - Abnormal; Notable for the following components:   Glucose, Bld 150 (*)    ALT 71 (*)    All other components within normal limits   SURGICAL PCR SCREEN  CBC  URINALYSIS, ROUTINE W REFLEX MICROSCOPIC  HEMOGLOBIN A1C  TYPE AND SCREEN     IMAGES:   EKG: 06/24/2019 Rate 96 bpm  Normal sinus rhythm Left bundle branch block Abnormal ECG No old tracing to compare  CV: Stress Test 08/04/2019  Defect 1: There is a medium defect of moderate severity present in the mid inferoseptal, mid inferior, apical septal and apical inferior location. There appears to be a significant amount of soft tissue attenuation. There is also reverse redistribution seen. There do not appear to be any large ischemic territories.  This is an intermediate risk study based on calculated LVEF.  Nuclear stress EF: 39%. However, LV systolic function appears grossly normal. I would recommend correlating with an echocardiogram.  Left bundle branch block seen throughout study.  Echo 07/01/2019 IMPRESSIONS    1. The left ventricle has a visually estimated ejection fraction of 55%. The cavity size was normal. There is mildly increased left ventricular wall thickness. Left ventricular diastolic Doppler parameters are consistent with impaired relaxation. There  is abnormal septal motion consistent with left bundle branch block.  2. The right ventricle has normal systolic function. The cavity was normal. There is no increase in right ventricular wall thickness.  3. The aortic valve has an indeterminate number of cusps. Moderate calcification of the aortic valve. Aortic valve regurgitation is mild by color flow Doppler. Mild-moderate stenosis of  the aortic valve with mean gradient 14 mmHg and dimentionless index  0.47. Mild aortic annular calcification noted.  4. The mitral valve is grossly normal. There is mild mitral annular calcification present.  5. The tricuspid valve is grossly normal.  6. The aorta is normal in size and structure. Past Medical History:  Diagnosis Date  . Abnormal EKG    left bundle branch blcok ekg 06-24-2019 saw dr Domenic Polite  cardiology for  . Arthritis    oa  . Heart murmur   . History of pneumonia    20 years ago  . Hypertension   . Type 2 diabetes mellitus (HCC)    Diet controlled    Past Surgical History:  Procedure Laterality Date  . right wrist surgery  yrs ago    MEDICATIONS: . acetaminophen (TYLENOL) 650 MG CR tablet  . amLODipine (NORVASC) 10 MG tablet  . atorvastatin (LIPITOR) 40 MG tablet  . losartan (COZAAR) 50 MG tablet   No current facility-administered medications for this encounter.     Maia Plan [facility] Pre-Surgical Testing [phone] 08/26/19  10:58 AM

## 2019-08-26 NOTE — Progress Notes (Signed)
PCP - NONE Cardiologist - DR Asbury, CARDIAC CLEARANCE  08-04-2019 EPIC   Chest x-ray - NONE EKG - 06-24-19 EPIC Stress Test - 08-04-19 EPIC ECHO - 07-01-19 EPIC Cardiac Cath - NONE  Sleep Study - NONE CPAP -   Fasting Blood Sugar -  Checks Blood Sugar NONE  Blood Thinner Instructions:NONE Aspirin Instructions:NONE Last Dose:   Anesthesia review: CHART TO JESSICA ZANETTO PA FOR REVIEW  Patient denies shortness of breath, fever, cough and chest pain at PAT appointment   Patient verbalized understanding of instructions that were given to them at the PAT appointment. Patient was also instructed that they will need to review over the PAT instructions again at home before surgery.

## 2019-08-26 NOTE — Anesthesia Preprocedure Evaluation (Addendum)
Anesthesia Evaluation  Patient identified by MRN, date of birth, ID band Patient awake    Reviewed: Allergy & Precautions, NPO status , Patient's Chart, lab work & pertinent test results  Airway Mallampati: II  TM Distance: >3 FB Neck ROM: Full    Dental  (+) Edentulous Lower, Poor Dentition,    Pulmonary neg pulmonary ROS,    Pulmonary exam normal breath sounds clear to auscultation       Cardiovascular hypertension, Pt. on medications Normal cardiovascular exam+ Valvular Problems/Murmurs AS  Rhythm:Regular Rate:Normal + Systolic murmurs [id number] Echo   1. The left ventricle has a visually estimated ejection fraction of 55%. The cavity size was normal. There is mildly increased left ventricular wall thickness. Left ventricular diastolic Doppler parameters are consistent with impaired relaxation. There  is abnormal septal motion consistent with left bundle branch block.  2. The right ventricle has normal systolic function. The cavity was normal. There is no increase in right ventricular wall thickness.  3. The aortic valve has an indeterminate number of cusps. Moderate calcification of the aortic valve. Aortic valve regurgitation is mild by color flow Doppler. Mild-moderate stenosis of the aortic valve with mean gradient 14 mmHg and dimentionless index   Neuro/Psych negative neurological ROS     GI/Hepatic negative GI ROS,   Endo/Other  diabetes, Type 2  Renal/GU negative Renal ROS  negative genitourinary   Musculoskeletal negative musculoskeletal ROS (+)   Abdominal (+) + obese,   Peds  Hematology Lab Results      Component                Value               Date                      WBC                      8.6                 08/26/2019                HGB                      15.6                08/26/2019                HCT                      45.1                08/26/2019                MCV                       87.7                08/26/2019                PLT                      245                 08/26/2019              Anesthesia Other Findings   Reproductive/Obstetrics negative OB ROS  Anesthesia Physical Anesthesia Plan  ASA: III  Anesthesia Plan: Spinal   Post-op Pain Management:    Induction: Intravenous  PONV Risk Score and Plan: 2 and Treatment may vary due to age or medical condition, Ondansetron and Dexamethasone  Airway Management Planned: Nasal Cannula and Natural Airway  Additional Equipment:   Intra-op Plan:   Post-operative Plan:   Informed Consent: I have reviewed the patients History and Physical, chart, labs and discussed the procedure including the risks, benefits and alternatives for the proposed anesthesia with the patient or authorized representative who has indicated his/her understanding and acceptance.     Dental advisory given  Plan Discussed with:   Anesthesia Plan Comments: (See PAT note 08/26/2019, Konrad Felix, PA-C)      Anesthesia Quick Evaluation

## 2019-08-27 ENCOUNTER — Ambulatory Visit (HOSPITAL_COMMUNITY): Payer: BC Managed Care – PPO

## 2019-08-27 ENCOUNTER — Ambulatory Visit (HOSPITAL_COMMUNITY): Payer: BC Managed Care – PPO | Admitting: Physician Assistant

## 2019-08-27 ENCOUNTER — Encounter (HOSPITAL_COMMUNITY)
Admission: RE | Disposition: A | Discharge status: Home / Self Care | Payer: Self-pay | Source: Home / Self Care | Attending: Orthopedic Surgery

## 2019-08-27 ENCOUNTER — Ambulatory Visit (HOSPITAL_COMMUNITY)
Admission: RE | Admit: 2019-08-27 | Discharge: 2019-08-28 | Disposition: A | Discharge status: Home / Self Care | Payer: BC Managed Care – PPO | Attending: Orthopedic Surgery | Admitting: Orthopedic Surgery

## 2019-08-27 ENCOUNTER — Ambulatory Visit (HOSPITAL_COMMUNITY): Payer: BC Managed Care – PPO | Admitting: Anesthesiology

## 2019-08-27 ENCOUNTER — Encounter (HOSPITAL_COMMUNITY): Payer: Self-pay | Admitting: Emergency Medicine

## 2019-08-27 ENCOUNTER — Other Ambulatory Visit: Payer: Self-pay

## 2019-08-27 DIAGNOSIS — Z6833 Body mass index (BMI) 33.0-33.9, adult: Secondary | ICD-10-CM | POA: Diagnosis not present

## 2019-08-27 DIAGNOSIS — E782 Mixed hyperlipidemia: Secondary | ICD-10-CM | POA: Diagnosis not present

## 2019-08-27 DIAGNOSIS — M25852 Other specified joint disorders, left hip: Secondary | ICD-10-CM | POA: Insufficient documentation

## 2019-08-27 DIAGNOSIS — M25752 Osteophyte, left hip: Secondary | ICD-10-CM | POA: Insufficient documentation

## 2019-08-27 DIAGNOSIS — Z09 Encounter for follow-up examination after completed treatment for conditions other than malignant neoplasm: Secondary | ICD-10-CM

## 2019-08-27 DIAGNOSIS — Z7982 Long term (current) use of aspirin: Secondary | ICD-10-CM | POA: Diagnosis not present

## 2019-08-27 DIAGNOSIS — E119 Type 2 diabetes mellitus without complications: Secondary | ICD-10-CM | POA: Diagnosis not present

## 2019-08-27 DIAGNOSIS — I447 Left bundle-branch block, unspecified: Secondary | ICD-10-CM | POA: Diagnosis not present

## 2019-08-27 DIAGNOSIS — E1169 Type 2 diabetes mellitus with other specified complication: Secondary | ICD-10-CM | POA: Diagnosis not present

## 2019-08-27 DIAGNOSIS — M1612 Unilateral primary osteoarthritis, left hip: Secondary | ICD-10-CM | POA: Diagnosis present

## 2019-08-27 DIAGNOSIS — E669 Obesity, unspecified: Secondary | ICD-10-CM | POA: Insufficient documentation

## 2019-08-27 DIAGNOSIS — Z419 Encounter for procedure for purposes other than remedying health state, unspecified: Secondary | ICD-10-CM

## 2019-08-27 DIAGNOSIS — Z79899 Other long term (current) drug therapy: Secondary | ICD-10-CM | POA: Insufficient documentation

## 2019-08-27 DIAGNOSIS — M8548 Solitary bone cyst, other site: Secondary | ICD-10-CM | POA: Diagnosis not present

## 2019-08-27 DIAGNOSIS — R011 Cardiac murmur, unspecified: Secondary | ICD-10-CM | POA: Insufficient documentation

## 2019-08-27 DIAGNOSIS — R2689 Other abnormalities of gait and mobility: Secondary | ICD-10-CM | POA: Diagnosis not present

## 2019-08-27 DIAGNOSIS — Z96642 Presence of left artificial hip joint: Secondary | ICD-10-CM | POA: Diagnosis not present

## 2019-08-27 DIAGNOSIS — Z471 Aftercare following joint replacement surgery: Secondary | ICD-10-CM | POA: Diagnosis not present

## 2019-08-27 DIAGNOSIS — I10 Essential (primary) hypertension: Secondary | ICD-10-CM | POA: Diagnosis not present

## 2019-08-27 HISTORY — PX: TOTAL HIP ARTHROPLASTY: SHX124

## 2019-08-27 LAB — HEMOGLOBIN A1C
Hgb A1c MFr Bld: 7.2 % — ABNORMAL HIGH (ref 4.8–5.6)
Mean Plasma Glucose: 160 mg/dL

## 2019-08-27 LAB — GLUCOSE, CAPILLARY
Glucose-Capillary: 162 mg/dL — ABNORMAL HIGH (ref 70–99)
Glucose-Capillary: 170 mg/dL — ABNORMAL HIGH (ref 70–99)
Glucose-Capillary: 400 mg/dL — ABNORMAL HIGH (ref 70–99)
Glucose-Capillary: 249 mg/dL — ABNORMAL HIGH (ref 70–99)

## 2019-08-27 LAB — TYPE AND SCREEN
ABO/RH(D): O POS
Antibody Screen: NEGATIVE

## 2019-08-27 LAB — PROTIME-INR
INR: 1 (ref 0.8–1.2)
Prothrombin Time: 13.2 seconds (ref 11.4–15.2)

## 2019-08-27 SURGERY — ARTHROPLASTY, HIP, TOTAL, ANTERIOR APPROACH
Anesthesia: Spinal | Laterality: Left

## 2019-08-27 MED ORDER — DIPHENHYDRAMINE HCL 12.5 MG/5ML PO ELIX: 12.5000 mg | ORAL_SOLUTION | ORAL | Status: DC | PRN

## 2019-08-27 MED ORDER — CHLORHEXIDINE GLUCONATE 4 % EX LIQD: 60.0000 mL | Freq: Once | CUTANEOUS | Status: DC

## 2019-08-27 MED ORDER — TRANEXAMIC ACID-NACL 1000-0.7 MG/100ML-% IV SOLN
1000.0000 mg | INTRAVENOUS | Status: AC
  Administered 2019-08-27: 1000 mg via INTRAVENOUS
  Filled 2019-08-27: qty 100

## 2019-08-27 MED ORDER — ONDANSETRON HCL 4 MG/2ML IJ SOLN
INTRAMUSCULAR | Status: AC
  Filled 2019-08-27: qty 2

## 2019-08-27 MED ORDER — PROPOFOL 10 MG/ML IV BOLUS
INTRAVENOUS | Status: AC
  Filled 2019-08-27: qty 60

## 2019-08-27 MED ORDER — INSULIN ASPART 100 UNIT/ML ~~LOC~~ SOLN
0.0000 [IU] | Freq: Every day | SUBCUTANEOUS | Status: DC
  Administered 2019-08-27: 22:00:00 2 [IU] via SUBCUTANEOUS

## 2019-08-27 MED ORDER — DEXAMETHASONE SODIUM PHOSPHATE 10 MG/ML IJ SOLN
INTRAMUSCULAR | Status: AC
  Filled 2019-08-27: qty 1

## 2019-08-27 MED ORDER — SODIUM CHLORIDE 0.9 % IR SOLN
Status: DC | PRN
  Administered 2019-08-27 (×2): 1000 mL
  Administered 2019-08-27: 3000 mL

## 2019-08-27 MED ORDER — SODIUM CHLORIDE 0.9 % IV SOLN: INTRAVENOUS | Status: DC

## 2019-08-27 MED ORDER — KETOROLAC TROMETHAMINE 30 MG/ML IJ SOLN
INTRAMUSCULAR | Status: DC | PRN
  Administered 2019-08-27: 30 mg

## 2019-08-27 MED ORDER — BUPIVACAINE-EPINEPHRINE (PF) 0.5% -1:200000 IJ SOLN
INTRAMUSCULAR | Status: AC
  Filled 2019-08-27: qty 30

## 2019-08-27 MED ORDER — FENTANYL CITRATE (PF) 100 MCG/2ML IJ SOLN
INTRAMUSCULAR | Status: DC | PRN
  Administered 2019-08-27 (×2): 50 ug via INTRAVENOUS

## 2019-08-27 MED ORDER — MIDAZOLAM HCL 5 MG/5ML IJ SOLN
INTRAMUSCULAR | Status: DC | PRN
  Administered 2019-08-27: 2 mg via INTRAVENOUS

## 2019-08-27 MED ORDER — POLYETHYLENE GLYCOL 3350 17 G PO PACK: 17.0000 g | PACK | Freq: Every day | ORAL | Status: DC | PRN

## 2019-08-27 MED ORDER — FENTANYL CITRATE (PF) 100 MCG/2ML IJ SOLN: 25.0000 ug | INTRAMUSCULAR | Status: DC | PRN

## 2019-08-27 MED ORDER — SODIUM CHLORIDE (PF) 0.9 % IJ SOLN
INTRAMUSCULAR | Status: DC | PRN
  Administered 2019-08-27: 30 mL

## 2019-08-27 MED ORDER — KETOROLAC TROMETHAMINE 30 MG/ML IJ SOLN
INTRAMUSCULAR | Status: AC
  Filled 2019-08-27: qty 1

## 2019-08-27 MED ORDER — WATER FOR IRRIGATION, STERILE IR SOLN
Status: DC | PRN
  Administered 2019-08-27: 2000 mL

## 2019-08-27 MED ORDER — SODIUM CHLORIDE 0.9 % IV SOLN
INTRAVENOUS | Status: DC
  Administered 2019-08-27: 15:00:00 via INTRAVENOUS

## 2019-08-27 MED ORDER — BUPIVACAINE-EPINEPHRINE 0.5% -1:200000 IJ SOLN
INTRAMUSCULAR | Status: DC | PRN
  Administered 2019-08-27: 30 mL

## 2019-08-27 MED ORDER — MIDAZOLAM HCL 2 MG/2ML IJ SOLN
INTRAMUSCULAR | Status: AC
  Filled 2019-08-27: qty 2

## 2019-08-27 MED ORDER — ATORVASTATIN CALCIUM 40 MG PO TABS
40.0000 mg | ORAL_TABLET | Freq: Every day | ORAL | Status: DC
  Administered 2019-08-28: 08:00:00 40 mg via ORAL
  Filled 2019-08-27: qty 1

## 2019-08-27 MED ORDER — ISOPROPYL ALCOHOL 70 % SOLN
Status: DC | PRN
  Administered 2019-08-27: 1 via TOPICAL

## 2019-08-27 MED ORDER — ACETAMINOPHEN 325 MG PO TABS
325.0000 mg | ORAL_TABLET | Freq: Four times a day (QID) | ORAL | Status: DC | PRN
  Administered 2019-08-28: 650 mg via ORAL
  Filled 2019-08-27: qty 2

## 2019-08-27 MED ORDER — METHOCARBAMOL 500 MG PO TABS
500.0000 mg | ORAL_TABLET | Freq: Four times a day (QID) | ORAL | Status: DC | PRN
  Administered 2019-08-27 – 2019-08-28 (×2): 500 mg via ORAL
  Filled 2019-08-27 (×2): qty 1

## 2019-08-27 MED ORDER — PHENOL 1.4 % MT LIQD: 1.0000 | OROMUCOSAL | Status: DC | PRN

## 2019-08-27 MED ORDER — PHENYLEPHRINE HCL (PRESSORS) 10 MG/ML IV SOLN
INTRAVENOUS | Status: DC | PRN
  Administered 2019-08-27: 80 ug via INTRAVENOUS

## 2019-08-27 MED ORDER — CEFAZOLIN SODIUM-DEXTROSE 2-4 GM/100ML-% IV SOLN
2.0000 g | Freq: Four times a day (QID) | INTRAVENOUS | Status: AC
  Administered 2019-08-27 (×2): 2 g via INTRAVENOUS
  Filled 2019-08-27 (×2): qty 100

## 2019-08-27 MED ORDER — ALUM & MAG HYDROXIDE-SIMETH 200-200-20 MG/5ML PO SUSP: 30.0000 mL | ORAL | Status: DC | PRN

## 2019-08-27 MED ORDER — ACETAMINOPHEN 10 MG/ML IV SOLN: 1000.0000 mg | Freq: Once | INTRAVENOUS | Status: DC | PRN

## 2019-08-27 MED ORDER — ISOPROPYL ALCOHOL 70 % SOLN
Status: AC
  Filled 2019-08-27: qty 480

## 2019-08-27 MED ORDER — METHOCARBAMOL 500 MG IVPB - SIMPLE MED
500.0000 mg | Freq: Four times a day (QID) | INTRAVENOUS | Status: DC | PRN
  Filled 2019-08-27: qty 50

## 2019-08-27 MED ORDER — ASPIRIN 81 MG PO CHEW
81.0000 mg | CHEWABLE_TABLET | Freq: Two times a day (BID) | ORAL | Status: DC
  Administered 2019-08-27 – 2019-08-28 (×2): 81 mg via ORAL
  Filled 2019-08-27 (×2): qty 1

## 2019-08-27 MED ORDER — CEFAZOLIN SODIUM-DEXTROSE 2-4 GM/100ML-% IV SOLN
2.0000 g | INTRAVENOUS | Status: AC
  Administered 2019-08-27: 2 g via INTRAVENOUS
  Filled 2019-08-27: qty 100

## 2019-08-27 MED ORDER — HYDROCODONE-ACETAMINOPHEN 7.5-325 MG PO TABS
1.0000 | ORAL_TABLET | ORAL | Status: DC | PRN
  Administered 2019-08-27: 13:00:00 1 via ORAL
  Filled 2019-08-27: qty 1

## 2019-08-27 MED ORDER — SODIUM CHLORIDE (PF) 0.9 % IJ SOLN
INTRAMUSCULAR | Status: AC
  Filled 2019-08-27: qty 50

## 2019-08-27 MED ORDER — ONDANSETRON HCL 4 MG PO TABS: 4.0000 mg | ORAL_TABLET | Freq: Four times a day (QID) | ORAL | Status: DC | PRN

## 2019-08-27 MED ORDER — FENTANYL CITRATE (PF) 100 MCG/2ML IJ SOLN
INTRAMUSCULAR | Status: AC
  Filled 2019-08-27: qty 2

## 2019-08-27 MED ORDER — CELECOXIB 200 MG PO CAPS
200.0000 mg | ORAL_CAPSULE | Freq: Two times a day (BID) | ORAL | Status: DC
  Administered 2019-08-27 (×2): 200 mg via ORAL
  Filled 2019-08-27 (×2): qty 1

## 2019-08-27 MED ORDER — PROPOFOL 10 MG/ML IV BOLUS
INTRAVENOUS | Status: AC
  Filled 2019-08-27: qty 20

## 2019-08-27 MED ORDER — LACTATED RINGERS IV SOLN
INTRAVENOUS | Status: DC
  Administered 2019-08-27 (×3): via INTRAVENOUS

## 2019-08-27 MED ORDER — SENNA 8.6 MG PO TABS
1.0000 | ORAL_TABLET | Freq: Two times a day (BID) | ORAL | Status: DC
  Administered 2019-08-27: 22:00:00 8.6 mg via ORAL
  Filled 2019-08-27: qty 1

## 2019-08-27 MED ORDER — ONDANSETRON HCL 4 MG/2ML IJ SOLN: 4.0000 mg | Freq: Four times a day (QID) | INTRAMUSCULAR | Status: DC | PRN

## 2019-08-27 MED ORDER — MORPHINE SULFATE (PF) 4 MG/ML IV SOLN: 0.5000 mg | INTRAVENOUS | Status: DC | PRN

## 2019-08-27 MED ORDER — BUPIVACAINE IN DEXTROSE 0.75-8.25 % IT SOLN
INTRATHECAL | Status: DC | PRN
  Administered 2019-08-27: 2 mL via INTRATHECAL

## 2019-08-27 MED ORDER — INSULIN ASPART 100 UNIT/ML ~~LOC~~ SOLN
0.0000 [IU] | Freq: Three times a day (TID) | SUBCUTANEOUS | Status: DC
  Administered 2019-08-28: 2 [IU] via SUBCUTANEOUS

## 2019-08-27 MED ORDER — POVIDONE-IODINE 10 % EX SWAB: 2.0000 "application " | Freq: Once | CUTANEOUS | Status: AC

## 2019-08-27 MED ORDER — METOCLOPRAMIDE HCL 5 MG/ML IJ SOLN: 5.0000 mg | Freq: Three times a day (TID) | INTRAMUSCULAR | Status: DC | PRN

## 2019-08-27 MED ORDER — HYDROCODONE-ACETAMINOPHEN 5-325 MG PO TABS: 1.0000 | ORAL_TABLET | ORAL | Status: DC | PRN

## 2019-08-27 MED ORDER — PROPOFOL 500 MG/50ML IV EMUL
INTRAVENOUS | Status: DC | PRN
  Administered 2019-08-27: 50 ug/kg/min via INTRAVENOUS

## 2019-08-27 MED ORDER — SODIUM CHLORIDE 0.9 % IV SOLN
INTRAVENOUS | Status: DC | PRN
  Administered 2019-08-27: 25 ug/min via INTRAVENOUS

## 2019-08-27 MED ORDER — MENTHOL 3 MG MT LOZG: 1.0000 | LOZENGE | OROMUCOSAL | Status: DC | PRN

## 2019-08-27 MED ORDER — DEXAMETHASONE SODIUM PHOSPHATE 10 MG/ML IJ SOLN: 10.0000 mg | Freq: Once | INTRAMUSCULAR | Status: DC

## 2019-08-27 MED ORDER — AMLODIPINE BESYLATE 10 MG PO TABS
10.0000 mg | ORAL_TABLET | Freq: Every day | ORAL | Status: DC
  Administered 2019-08-28: 08:00:00 10 mg via ORAL
  Filled 2019-08-27: qty 1

## 2019-08-27 MED ORDER — POVIDONE-IODINE 10 % EX SWAB
2.0000 "application " | Freq: Once | CUTANEOUS | Status: AC
  Administered 2019-08-27: 2 via TOPICAL

## 2019-08-27 MED ORDER — ONDANSETRON HCL 4 MG/2ML IJ SOLN: 4.0000 mg | Freq: Once | INTRAMUSCULAR | Status: DC | PRN

## 2019-08-27 MED ORDER — METOCLOPRAMIDE HCL 5 MG PO TABS: 5.0000 mg | ORAL_TABLET | Freq: Three times a day (TID) | ORAL | Status: DC | PRN

## 2019-08-27 MED ORDER — DOCUSATE SODIUM 100 MG PO CAPS
100.0000 mg | ORAL_CAPSULE | Freq: Two times a day (BID) | ORAL | Status: DC
  Administered 2019-08-27: 22:00:00 100 mg via ORAL
  Filled 2019-08-27: qty 1

## 2019-08-27 SURGICAL SUPPLY — 60 items
ACETAB CUP W GRIPTION 54MM (Plate) ×1 IMPLANT
ACETAB CUP W/GRIPTION 54 (Plate) ×2 IMPLANT
BAG DECANTER FOR FLEXI CONT (MISCELLANEOUS) IMPLANT
BAG ZIPLOCK 12X15 (MISCELLANEOUS) IMPLANT
BLADE SURG SZ10 CARB STEEL (BLADE) IMPLANT
CHLORAPREP W/TINT 26 (MISCELLANEOUS) ×3 IMPLANT
COVER PERINEAL POST (MISCELLANEOUS) ×3 IMPLANT
COVER SURGICAL LIGHT HANDLE (MISCELLANEOUS) ×3 IMPLANT
COVER WAND RF STERILE (DRAPES) IMPLANT
CUP ACETAB W/GRIPTION 54 (Plate) IMPLANT
DECANTER SPIKE VIAL GLASS SM (MISCELLANEOUS) ×5 IMPLANT
DERMABOND ADVANCED (GAUZE/BANDAGES/DRESSINGS) ×4
DERMABOND ADVANCED .7 DNX12 (GAUZE/BANDAGES/DRESSINGS) ×2 IMPLANT
DRAPE IMP U-DRAPE 54X76 (DRAPES) ×3 IMPLANT
DRAPE SHEET LG 3/4 BI-LAMINATE (DRAPES) ×9 IMPLANT
DRAPE STERI IOBAN 125X83 (DRAPES) IMPLANT
DRAPE U-SHAPE 47X51 STRL (DRAPES) ×6 IMPLANT
DRESSING AQUACEL AG SP 3.5X10 (GAUZE/BANDAGES/DRESSINGS) IMPLANT
DRSG AQUACEL AG ADV 3.5X10 (GAUZE/BANDAGES/DRESSINGS) ×3 IMPLANT
DRSG AQUACEL AG SP 3.5X10 (GAUZE/BANDAGES/DRESSINGS) ×3
ELECT PENCIL ROCKER SW 15FT (MISCELLANEOUS) ×3 IMPLANT
ELECT REM PT RETURN 15FT ADLT (MISCELLANEOUS) ×3 IMPLANT
GAUZE SPONGE 4X4 12PLY STRL (GAUZE/BANDAGES/DRESSINGS) ×3 IMPLANT
GLOVE BIO SURGEON STRL SZ8.5 (GLOVE) ×6 IMPLANT
GLOVE BIOGEL PI IND STRL 8.5 (GLOVE) ×1 IMPLANT
GLOVE BIOGEL PI INDICATOR 8.5 (GLOVE) ×2
GOWN SPEC L3 XXLG W/TWL (GOWN DISPOSABLE) ×3 IMPLANT
HANDPIECE INTERPULSE COAX TIP (DISPOSABLE) ×2
HEAD CERAMIC DELTA 36 PLUS 1.5 (Hips) ×2 IMPLANT
HOLDER FOLEY CATH W/STRAP (MISCELLANEOUS) ×3 IMPLANT
HOOD PEEL AWAY FLYTE STAYCOOL (MISCELLANEOUS) ×12 IMPLANT
JET LAVAGE IRRISEPT WOUND (IRRIGATION / IRRIGATOR) ×3
KIT TURNOVER KIT A (KITS) IMPLANT
LAVAGE JET IRRISEPT WOUND (IRRIGATION / IRRIGATOR) ×1 IMPLANT
LINER NEUTRAL 54X36MM PLUS 4 (Hips) ×2 IMPLANT
MANIFOLD NEPTUNE II (INSTRUMENTS) ×3 IMPLANT
MARKER SKIN DUAL TIP RULER LAB (MISCELLANEOUS) ×3 IMPLANT
NDL SAFETY ECLIPSE 18X1.5 (NEEDLE) ×1 IMPLANT
NDL SPNL 18GX3.5 QUINCKE PK (NEEDLE) ×1 IMPLANT
NEEDLE HYPO 18GX1.5 SHARP (NEEDLE) ×2
NEEDLE SPNL 18GX3.5 QUINCKE PK (NEEDLE) ×3 IMPLANT
PACK ANTERIOR HIP CUSTOM (KITS) ×3 IMPLANT
SAW OSC TIP CART 19.5X105X1.3 (SAW) ×3 IMPLANT
SEALER BIPOLAR AQUA 6.0 (INSTRUMENTS) ×3 IMPLANT
SET HNDPC FAN SPRY TIP SCT (DISPOSABLE) ×1 IMPLANT
STEM TRI LOC BPS GRIPTON SZ 5 (Hips) IMPLANT
SUT ETHIBOND NAB CT1 #1 30IN (SUTURE) ×6 IMPLANT
SUT MNCRL AB 3-0 PS2 18 (SUTURE) ×3 IMPLANT
SUT MNCRL AB 4-0 PS2 18 (SUTURE) ×3 IMPLANT
SUT MON AB 2-0 CT1 36 (SUTURE) ×6 IMPLANT
SUT STRATAFIX PDO 1 14 VIOLET (SUTURE) ×2
SUT STRATFX PDO 1 14 VIOLET (SUTURE) ×1
SUT VIC AB 2-0 CT1 27 (SUTURE) ×2
SUT VIC AB 2-0 CT1 TAPERPNT 27 (SUTURE) ×1 IMPLANT
SUTURE STRATFX PDO 1 14 VIOLET (SUTURE) ×1 IMPLANT
SYR 3ML LL SCALE MARK (SYRINGE) ×3 IMPLANT
TRAY FOLEY MTR SLVR 16FR STAT (SET/KITS/TRAYS/PACK) ×2 IMPLANT
TRI LOC BPS W GRIPTON SZ 5 (Hips) ×3 IMPLANT
WATER STERILE IRR 1000ML POUR (IV SOLUTION) ×5 IMPLANT
YANKAUER SUCT BULB TIP 10FT TU (MISCELLANEOUS) ×3 IMPLANT

## 2019-08-27 NOTE — Anesthesia Postprocedure Evaluation (Signed)
Anesthesia Post Note  Patient: Gary Lopez  Procedure(s) Performed: TOTAL HIP ARTHROPLASTY ANTERIOR APPROACH (Left )     Patient location during evaluation: Nursing Unit Anesthesia Type: Spinal Level of consciousness: oriented and awake and alert Pain management: pain level controlled Vital Signs Assessment: post-procedure vital signs reviewed and stable Respiratory status: spontaneous breathing and respiratory function stable Cardiovascular status: blood pressure returned to baseline and stable Postop Assessment: no headache, no backache, no apparent nausea or vomiting and patient able to bend at knees Anesthetic complications: no    Last Vitals:  Vitals:   08/27/19 1326 08/27/19 1409  BP: (!) 129/95 116/77  Pulse: (!) 110 98  Resp:  16  Temp:    SpO2: 94% 94%    Last Pain:  Vitals:   08/27/19 1410  TempSrc:   PainSc: 3                  Barnet Glasgow

## 2019-08-27 NOTE — Op Note (Signed)
OPERATIVE REPORT  SURGEON: Rod Can, MD   ASSISTANT: Nehemiah Massed, PA-C.  PREOPERATIVE DIAGNOSIS: Left hip arthritis.   POSTOPERATIVE DIAGNOSIS: Left hip arthritis.   PROCEDURE: Left total hip arthroplasty, anterior approach.   IMPLANTS: DePuy Tri Lock stem, size 5, hi offset. DePuy Pinnacle Cup, size 54 mm. DePuy Altrx liner, size 36 by 54 mm, +4 neutral. DePuy Biolox ceramic head ball, size 36 + 1.5 mm.  ANESTHESIA:  Spinal  ESTIMATED BLOOD LOSS: 400 mL.    ANTIBIOTICS: 2 g Ancef.  DRAINS: None.  COMPLICATIONS: None.   CONDITION: PACU - hemodynamically stable.   BRIEF CLINICAL NOTE: Gary Lopez is a 68 y.o. male with a long-standing history of Left hip arthritis. After failing conservative management, the patient was indicated for total hip arthroplasty. The risks, benefits, and alternatives to the procedure were explained, and the patient elected to proceed.  PROCEDURE IN DETAIL: Surgical site was marked by myself in the pre-op holding area. Once inside the operating room, spinal anesthesia was obtained, and a foley catheter was inserted. The patient was then positioned on the Hana table.  All bony prominences were well padded.  The hip was prepped and draped in the normal sterile surgical fashion.  A time-out was called verifying side and site of surgery. The patient received IV antibiotics within 60 minutes of beginning the procedure.   The direct anterior approach to the hip was performed through the Hueter interval.  Lateral femoral circumflex vessels were treated with the Auqumantys. The anterior capsule was exposed and an inverted T capsulotomy was made. The femoral neck cut was made to the level of the templated cut.  A corkscrew was placed into the head and the head was removed.  The femoral head was found to have eburnated bone. The head was passed to the back table and was measured.   Acetabular exposure was achieved, and the pulvinar and labrum were  excised. Sequential reaming of the acetabulum was then performed up to a size 53 mm reamer. A 54 mm cup was then opened and impacted into place at approximately 40 degrees of abduction and 20 degrees of anteversion. The final polyethylene liner was impacted into place and acetabular osteophytes were removed.    I then gained femoral exposure taking care to protect the abductors and greater trochanter.  This was performed using standard external rotation, extension, and adduction.  The capsule was peeled off the inner aspect of the greater trochanter, taking care to preserve the short external rotators. A cookie cutter was used to enter the femoral canal, and then the femoral canal finder was placed.  Sequential broaching was performed up to a size 5.  Calcar planer was used on the femoral neck remnant.  I placed a hi offset neck and a trial head ball.  The hip was reduced.  Leg lengths and offset were checked fluoroscopically.  The hip was dislocated and trial components were removed.  The final implants were placed, and the hip was reduced.  Fluoroscopy was used to confirm component position and leg lengths.  At 90 degrees of external rotation and full extension, the hip was stable to an anterior directed force.   The wound was copiously irrigated with Irrisept solution and normal saline using pule lavage.  Marcaine solution was injected into the periarticular soft tissue.  The wound was closed in layers using #1 Stratafix for the fascia, 2-0 Vicryl for the subcutaneous fat, 2-0 Monocryl for the deep dermal layer, 3-0 running Monocryl subcuticular  stitch, and Dermabond for the skin.  Once the glue was fully dried, an Aquacell Ag dressing was applied.  The patient was transported to the recovery room in stable condition.  Sponge, needle, and instrument counts were correct at the end of the case x2.  The patient tolerated the procedure well and there were no known complications.  Please note that a surgical  assistant was a medical necessity for this procedure to perform it in a safe and expeditious manner. Assistant was necessary to provide appropriate retraction of vital neurovascular structures, to prevent femoral fracture, and to allow for anatomic placement of the prosthesis.

## 2019-08-27 NOTE — Anesthesia Procedure Notes (Signed)
Procedure Name: MAC Date/Time: 08/27/2019 7:32 AM Performed by: Lissa Morales, CRNA Pre-anesthesia Checklist: Patient identified, Emergency Drugs available, Suction available, Patient being monitored and Timeout performed Oxygen Delivery Method: Simple face mask Placement Confirmation: positive ETCO2

## 2019-08-27 NOTE — Evaluation (Signed)
Physical Therapy Evaluation Patient Details Name: Gary Lopez MRN: [id number] DOB: 1951-10-07 Today's Date: 08/27/2019   History of Present Illness  68 y.o. patient s/p L THR diect ant approach on 08/27/19. He has PMH significant for DM, HTN, arthritis, and past Rt wrist surgery.    Clinical Impression  Gary Lopez is a 68 y.o. male POD 0 s/p LT THR direct ant approach. Patient reports independence with mobility at baseline. Patient is now limited by functional impairments (see PT problem list below) and requires min assist/guard for transfers and gait with RW. Patient was able to ambulate ~120 feet with RW and min assist requiring intermittent cues for safe hand placement. Patient instructed in exercise to facilitate ROM and circulation to manage edema. Patient will benefit from continued skilled PT interventions to address impairments and progress towards PLOF. Acute PT will follow to progress mobility and stair training in preparation for safe discharge home.     Follow Up Recommendations Follow surgeon's recommendation for DC plan and follow-up therapies    Equipment Recommendations  None recommended by PT(recommended pt purchase a shower seat tat can fit in the tub)    Recommendations for Other Services       Precautions / Restrictions Precautions Precautions: Fall Restrictions Weight Bearing Restrictions: No      Mobility  Bed Mobility Overal bed mobility: Needs Assistance Bed Mobility: Supine to Sit     Supine to sit: Min assist     General bed mobility comments: pt with effortful transfer supine to sit cue required to bring LE's off bed and assist to raise trunk initially  Transfers Overall transfer level: Needs assistance Equipment used: Rolling walker (2 wheeled) Transfers: Sit to/from Stand Sit to Stand: Min guard         General transfer comment: cues required for safe hand placement on RW and for safety as pt is eager to  mobilize  Ambulation/Gait Ambulation/Gait assistance: Min Gaffer (Feet): 120 Feet Assistive device: Rolling walker (2 wheeled) Gait Pattern/deviations: Step-through pattern;Decreased stride length;Decreased weight shift to left Gait velocity: decreased   General Gait Details: patient relying on UE's to unweight Lt LE slightly. cues required for safe hand placement to to keep both hands on RW while mobilizing for safety  Stairs            Wheelchair Mobility    Modified Rankin (Stroke Patients Only)       Balance Overall balance assessment: Needs assistance Sitting-balance support: Feet supported;No upper extremity supported Sitting balance-Leahy Scale: Good     Standing balance support: Bilateral upper extremity supported;During functional activity Standing balance-Leahy Scale: Fair Standing balance comment: pt can maintian static standing, UE support required for gait          Pertinent Vitals/Pain Pain Assessment: Faces Faces Pain Scale: Hurts a little bit Pain Location: Lt hip Pain Intervention(s): Limited activity within patient's tolerance;Monitored during session;Ice applied    Home Living Family/patient expects to be discharged to:: Private residence Living Arrangements: Spouse/significant other Available Help at Discharge: Family Type of Home: House Home Access: Stairs to enter;Ramped entrance Entrance Stairs-Rails: None Entrance Stairs-Number of Steps: 2 steps at front with no rails, ramp at back and threshold at door Home Layout: One level Home Equipment: Environmental consultant - 2 wheels;Cane - single point      Prior Function Level of Independence: Independent           Hand Dominance        Extremity/Trunk Assessment  Upper Extremity Assessment Upper Extremity Assessment: Overall WFL for tasks assessed    Lower Extremity Assessment Lower Extremity Assessment: Overall WFL for tasks assessed;LLE deficits/detail LLE  Deficits / Details: pt with light touch senstaion intact, good quad contraction and no extensor lag with SLR    Cervical / Trunk Assessment Cervical / Trunk Assessment: Normal  Communication   Communication: No difficulties  Cognition Arousal/Alertness: Awake/alert Behavior During Therapy: WFL for tasks assessed/performed Overall Cognitive Status: Within Functional Limits for tasks assessed                 General Comments      Exercises Total Joint Exercises Ankle Circles/Pumps: AROM;10 reps;Seated;Both Quad Sets: AROM;5 reps;Left;Seated(LE elevated) Hip ABduction/ADduction: AROM;5 reps;Left;Supine;Seated(LE elevated) Long Arc Quad: AROM;5 reps;Seated;Left   Assessment/Plan    PT Assessment Patient needs continued PT services  PT Problem List Decreased strength;Decreased activity tolerance;Decreased range of motion;Decreased balance;Decreased mobility       PT Treatment Interventions DME instruction;Gait training;Therapeutic activities;Functional mobility training;Balance training;Patient/family education;Manual techniques;Therapeutic exercise;Stair training    PT Goals (Current goals can be found in the Care Plan section)  Acute Rehab PT Goals Patient Stated Goal: to go home PT Goal Formulation: With patient/family Time For Goal Achievement: 09/03/19 Potential to Achieve Goals: Good    Frequency 7X/week    AM-PAC PT "6 Clicks" Mobility  Outcome Measure Help needed turning from your back to your side while in a flat bed without using bedrails?: A Little Help needed moving from lying on your back to sitting on the side of a flat bed without using bedrails?: A Little Help needed moving to and from a bed to a chair (including a wheelchair)?: A Little Help needed standing up from a chair using your arms (e.g., wheelchair or bedside chair)?: A Little Help needed to walk in hospital room?: A Little Help needed climbing 3-5 steps with a railing? : A Little 6 Click  Score: 18    End of Session Equipment Utilized During Treatment: Gait belt Activity Tolerance: Patient tolerated treatment well Patient left: in chair;with call bell/phone within reach;with chair alarm set;with family/visitor present Nurse Communication: Mobility status PT Visit Diagnosis: Other abnormalities of gait and mobility (R26.89);Unsteadiness on feet (R26.81);Muscle weakness (generalized) (M62.81);Difficulty in walking, not elsewhere classified (R26.2)    Time: [id number] PT Time Calculation (min) (ACUTE ONLY): 19 min   Charges:   PT Evaluation $PT Eval Low Complexity: 1 Low          Kipp Brood, PT, DPT, [facility] Physical Therapist with [facility]  08/27/2019 5:01 PM

## 2019-08-27 NOTE — Anesthesia Procedure Notes (Addendum)
Spinal  Patient location during procedure: OR Start time: 08/27/2019 7:35 AM End time: 08/27/2019 7:43 AM Staffing Anesthesiologist: Barnet Glasgow, MD Performed: anesthesiologist  Preanesthetic Checklist Completed: patient identified, site marked, surgical consent, pre-op evaluation, timeout performed, IV checked, risks and benefits discussed and monitors and equipment checked Spinal Block Patient position: sitting Prep: DuraPrep Patient monitoring: heart rate, cardiac monitor, continuous pulse ox and blood pressure Approach: midline Location: L2-3 Injection technique: single-shot Needle Needle type: Pencan  Needle gauge: 24 G Needle length: 9 cm Needle insertion depth: 6 cm Assessment Sensory level: T4 Additional Notes 2 attempts. Pt tolerated procedure well

## 2019-08-27 NOTE — Discharge Instructions (Signed)
°Dr. Liron Eissler °Joint Replacement Specialist °[facility] °[address] °[place], [place] [address] °[phone] ° ° °TOTAL HIP REPLACEMENT POSTOPERATIVE DIRECTIONS ° ° ° °Hip Rehabilitation, Guidelines Following Surgery  ° °WEIGHT BEARING °Weight bearing as tolerated with assist device (walker, cane, etc) as directed, use it as long as suggested by your surgeon or therapist, typically at least 4-6 weeks. ° °The results of a hip operation are greatly improved after range of motion and muscle strengthening exercises. Follow all safety measures which are given to protect your hip. If any of these exercises cause increased pain or swelling in your joint, decrease the amount until you are comfortable again. Then slowly increase the exercises. Call your caregiver if you have problems or questions.  ° °HOME CARE INSTRUCTIONS  °Most of the following instructions are designed to prevent the dislocation of your new hip.  °Remove items at home which could result in a fall. This includes throw rugs or furniture in walking pathways.  °Continue medications as instructed at time of discharge. °· You may have some home medications which will be placed on hold until you complete the course of blood thinner medication. °· You may start showering once you are discharged home. Do not remove your dressing. °Do not put on socks or shoes without following the instructions of your caregivers.   °Sit on chairs with arms. Use the chair arms to help push yourself up when arising.  °Arrange for the use of a toilet seat elevator so you are not sitting low.  °· Walk with walker as instructed.  °You may resume a sexual relationship in one month or when given the OK by your caregiver.  °Use walker as long as suggested by your caregivers.  °You may put full weight on your legs and walk as much as is comfortable. °Avoid periods of inactivity such as sitting longer than an hour when not asleep. This helps prevent  blood clots.  °You may return to work once you are cleared by your surgeon.  °Do not drive a car for 6 weeks or until released by your surgeon.  °Do not drive while taking narcotics.  °Wear elastic stockings for two weeks following surgery during the day but you may remove then at night.  °Make sure you keep all of your appointments after your operation with all of your doctors and caregivers. You should call the office at the above phone number and make an appointment for approximately two weeks after the date of your surgery. °Please pick up a stool softener and laxative for home use as long as you are requiring pain medications. °· ICE to the affected hip every three hours for 30 minutes at a time and then as needed for pain and swelling. Continue to use ice on the hip for pain and swelling from surgery. You may notice swelling that will progress down to the foot and ankle.  This is normal after surgery.  Elevate the leg when you are not up walking on it.   °It is important for you to complete the blood thinner medication as prescribed by your doctor. °· Continue to use the breathing machine which will help keep your temperature down.  It is common for your temperature to cycle up and down following surgery, especially at night when you are not up moving around and exerting yourself.  The breathing machine keeps your lungs expanded and your temperature down. ° °RANGE OF MOTION AND STRENGTHENING EXERCISES  °These exercises are   designed to help you keep full movement of your hip joint. Follow your caregiver's or physical therapist's instructions. Perform all exercises about fifteen times, three times per day or as directed. Exercise both hips, even if you have had only one joint replacement. These exercises can be done on a training (exercise) mat, on the floor, on a table or on a bed. Use whatever works the best and is most comfortable for you. Use music or television while you are exercising so that the exercises  are a pleasant break in your day. This will make your life better with the exercises acting as a break in routine you can look forward to.  °Lying on your back, slowly slide your foot toward your buttocks, raising your knee up off the floor. Then slowly slide your foot back down until your leg is straight again.  °Lying on your back spread your legs as far apart as you can without causing discomfort.  °Lying on your side, raise your upper leg and foot straight up from the floor as far as is comfortable. Slowly lower the leg and repeat.  °Lying on your back, tighten up the muscle in the front of your thigh (quadriceps muscles). You can do this by keeping your leg straight and trying to raise your heel off the floor. This helps strengthen the largest muscle supporting your knee.  °Lying on your back, tighten up the muscles of your buttocks both with the legs straight and with the knee bent at a comfortable angle while keeping your heel on the floor.  ° °SKILLED REHAB INSTRUCTIONS: °If the patient is transferred to a skilled rehab facility following release from the hospital, a list of the current medications will be sent to the facility for the patient to continue.  When discharged from the skilled rehab facility, please have the facility set up the patient's Home Health Physical Therapy prior to being released. Also, the skilled facility will be responsible for providing the patient with their medications at time of release from the facility to include their pain medication and their blood thinner medication. If the patient is still at the rehab facility at time of the two week follow up appointment, the skilled rehab facility will also need to assist the patient in arranging follow up appointment in our office and any transportation needs. ° °MAKE SURE YOU:  °Understand these instructions.  °Will watch your condition.  °Will get help right away if you are not doing well or get worse. ° °Pick up stool softner and  laxative for home use following surgery while on pain medications. °Do not remove your dressing. °The dressing is waterproof--it is OK to take showers. °Continue to use ice for pain and swelling after surgery. °Do not use any lotions or creams on the incision until instructed by your surgeon. °Total Hip Protocol. ° ° °

## 2019-08-27 NOTE — Transfer of Care (Signed)
Immediate Anesthesia Transfer of Care Note  Patient: Gary Lopez  Procedure(s) Performed: TOTAL HIP ARTHROPLASTY ANTERIOR APPROACH (Left )  Patient Location: PACU  Anesthesia Type:Spinal  Level of Consciousness: awake, alert  and patient cooperative  Airway & Oxygen Therapy: Patient Spontanous Breathing and Patient connected to face mask oxygen  Post-op Assessment: Report given to RN, Post -op Vital signs reviewed and stable and Patient moving all extremities X 4  Post vital signs: stable  Last Vitals:  Vitals Value Taken Time  BP    Temp    Pulse 84 08/27/19 1004  Resp 16 08/27/19 1004  SpO2 99 % 08/27/19 1004  Vitals shown include unvalidated device data.  Last Pain:  Vitals:   08/27/19 0558  TempSrc:   PainSc: 0-No pain      Patients Stated Pain Goal: 4 ([id number])  Complications: No apparent anesthesia complications

## 2019-08-27 NOTE — H&P (Signed)
TOTAL HIP ADMISSION H&P  Patient is admitted for left total hip arthroplasty.  Subjective:  Chief Complaint: left hip pain  HPI: Gary Lopez, 68 y.o. male, has a history of pain and functional disability in the left hip(s) due to arthritis and patient has failed non-surgical conservative treatments for greater than 12 weeks to include NSAID's and/or analgesics, flexibility and strengthening excercises, use of assistive devices, weight reduction as appropriate and activity modification.  Onset of symptoms was gradual starting 2 years ago with gradually worsening course since that time.The patient noted no past surgery on the left hip(s).  Patient currently rates pain in the left hip at 10 out of 10 with activity. Patient has night pain, worsening of pain with activity and weight bearing, trendelenberg gait, pain that interfers with activities of daily living and pain with passive range of motion. Patient has evidence of subchondral cysts, subchondral sclerosis, periarticular osteophytes and joint space narrowing by imaging studies. This condition presents safety issues increasing the risk of falls. There is no current active infection.  Patient Active Problem List   Diagnosis Date Noted  . New onset type 2 diabetes mellitus ([place]) 03/09/2019  . Mixed hyperlipidemia 12/08/2018  . Chronic left hip pain 11/24/2018  . Leg length discrepancy 11/24/2018  . Hypertension associated with diabetes ([place]) 11/24/2018   Past Medical History:  Diagnosis Date  . Abnormal EKG    left bundle branch blcok ekg 06-24-2019 saw dr Domenic Polite cardiology for  . Arthritis    oa  . Heart murmur   . History of pneumonia    20 years ago  . Hypertension   . Type 2 diabetes mellitus (H[facility])    Diet controlled    Past Surgical History:  Procedure Laterality Date  . right wrist surgery  yrs ago    Current Facility-Administered Medications  Medication Dose Route Frequency Provider Last Rate Last Dose  . 0.9 %  sodium  chloride infusion   Intravenous Continuous Courtany Mcmurphy, Aaron Edelman, MD      . ceFAZolin (ANCEF) IVPB 2g/100 mL premix  2 g Intravenous On Call to [place], Aaron Edelman, MD      . chlorhexidine (HIBICLENS) 4 % liquid 4 application  60 mL Topical Once Avacyn Kloosterman, Aaron Edelman, MD      . chlorhexidine (HIBICLENS) 4 % liquid 4 application  60 mL Topical Once Rod Can, MD      . lactated ringers infusion   Intravenous Continuous Barnet Glasgow, MD 50 mL/hr at 08/27/19 0600    . tranexamic acid (CYKLOKAPRON) IVPB 1,000 mg  1,000 mg Intravenous To OR Aquiles Ruffini, Aaron Edelman, MD       No Known Allergies  Social History   Tobacco Use  . Smoking status: Never Smoker  . Smokeless tobacco: Never Used  Substance Use Topics  . Alcohol use: Not Currently    Family History  Problem Relation Age of Onset  . Heart disease Mother   . COPD Father      Review of Systems  Constitutional: Negative.   HENT: Negative.   Respiratory: Negative.   Cardiovascular: Negative.   Gastrointestinal: Negative.   Genitourinary: Negative.   Musculoskeletal: Positive for joint pain.  Skin: Negative.   Neurological: Negative.   Endo/Heme/Allergies: Negative.   Psychiatric/Behavioral: Negative.     Objective:  Physical Exam  Vitals reviewed. Constitutional: He is oriented to person, place, and time. He appears well-developed and well-nourished.  HENT:  Head: Normocephalic and atraumatic.  Eyes: Pupils are equal, round, and reactive to  light. Conjunctivae and EOM are normal.  Neck: Normal range of motion. Neck supple.  Cardiovascular: Normal rate, regular rhythm and intact distal pulses.  Respiratory: Effort normal. No respiratory distress.  GI: Soft. He exhibits no distension.  Genitourinary:    Genitourinary Comments: deferred   Musculoskeletal:     Left hip: He exhibits decreased range of motion, decreased strength and bony tenderness.  Neurological: He is alert and oriented to person, place, and time. He has normal  reflexes.  Skin: Skin is warm and dry.  Psychiatric: He has a normal mood and affect. His behavior is normal. Judgment and thought content normal.    Vital signs in last 24 hours: Temp:  [98.2 F (36.8 C)-98.4 F (36.9 C)] 98.4 F (36.9 C) (09/17 0536) Pulse Rate:  [89-95] 95 (09/17 0536) Resp:  [18] 18 (09/17 0536) BP: (160-178)/(81-108) 178/108 (09/17 0536) SpO2:  [97 %-98 %] 97 % (09/17 0536) Weight:  [100.9 kg] 100.9 kg (09/17 0558)  Labs:   Estimated body mass index is 33.82 kg/m as calculated from the following:   Height as of this encounter: 5\' 8"  (1.727 m).   Weight as of this encounter: 100.9 kg.   Imaging Review Plain radiographs demonstrate severe degenerative joint disease of the left hip(s). The bone quality appears to be adequate for age and reported activity level.      Assessment/Plan:  End stage arthritis, left hip(s)  The patient history, physical examination, clinical judgement of the provider and imaging studies are consistent with end stage degenerative joint disease of the left hip(s) and total hip arthroplasty is deemed medically necessary. The treatment options including medical management, injection therapy, arthroscopy and arthroplasty were discussed at length. The risks and benefits of total hip arthroplasty were presented and reviewed. The risks due to aseptic loosening, infection, stiffness, dislocation/subluxation,  thromboembolic complications and other imponderables were discussed.  The patient acknowledged the explanation, agreed to proceed with the plan and consent was signed. Patient is being admitted for inpatient treatment for surgery, pain control, PT, OT, prophylactic antibiotics, VTE prophylaxis, progressive ambulation and ADL's and discharge planning.The patient is planning to be discharged home with HEP   Anticipated LOS equal to or greater than 2 midnights due to - Age 54 and older with one or more of the following:  - Obesity  -  Expected need for hospital services (PT, OT, Nursing) required for safe  discharge  - Anticipated need for postoperative skilled nursing care or inpatient rehab  - Active co-morbidities: Diabetes OR   - Unanticipated findings during/Post Surgery: None  - Patient is a high risk of re-admission due to: None

## 2019-08-28 ENCOUNTER — Encounter (HOSPITAL_COMMUNITY): Payer: Self-pay | Admitting: Orthopedic Surgery

## 2019-08-28 DIAGNOSIS — R011 Cardiac murmur, unspecified: Secondary | ICD-10-CM | POA: Diagnosis not present

## 2019-08-28 DIAGNOSIS — M1612 Unilateral primary osteoarthritis, left hip: Secondary | ICD-10-CM | POA: Diagnosis not present

## 2019-08-28 DIAGNOSIS — Z79899 Other long term (current) drug therapy: Secondary | ICD-10-CM | POA: Diagnosis not present

## 2019-08-28 DIAGNOSIS — M25852 Other specified joint disorders, left hip: Secondary | ICD-10-CM | POA: Diagnosis not present

## 2019-08-28 DIAGNOSIS — R2689 Other abnormalities of gait and mobility: Secondary | ICD-10-CM | POA: Diagnosis not present

## 2019-08-28 DIAGNOSIS — E669 Obesity, unspecified: Secondary | ICD-10-CM | POA: Diagnosis not present

## 2019-08-28 DIAGNOSIS — M25752 Osteophyte, left hip: Secondary | ICD-10-CM | POA: Diagnosis not present

## 2019-08-28 DIAGNOSIS — E782 Mixed hyperlipidemia: Secondary | ICD-10-CM | POA: Diagnosis not present

## 2019-08-28 DIAGNOSIS — M8548 Solitary bone cyst, other site: Secondary | ICD-10-CM | POA: Diagnosis not present

## 2019-08-28 DIAGNOSIS — E1169 Type 2 diabetes mellitus with other specified complication: Secondary | ICD-10-CM | POA: Diagnosis not present

## 2019-08-28 DIAGNOSIS — Z7982 Long term (current) use of aspirin: Secondary | ICD-10-CM | POA: Diagnosis not present

## 2019-08-28 DIAGNOSIS — Z6833 Body mass index (BMI) 33.0-33.9, adult: Secondary | ICD-10-CM | POA: Diagnosis not present

## 2019-08-28 DIAGNOSIS — I447 Left bundle-branch block, unspecified: Secondary | ICD-10-CM | POA: Diagnosis not present

## 2019-08-28 LAB — CBC
HCT: 32.7 % — ABNORMAL LOW (ref 39.0–52.0)
Hemoglobin: 11.3 g/dL — ABNORMAL LOW (ref 13.0–17.0)
MCH: 30.9 pg (ref 26.0–34.0)
MCHC: 34.6 g/dL (ref 30.0–36.0)
MCV: 89.3 fL (ref 80.0–100.0)
Platelets: 219 10*3/uL (ref 150–400)
RBC: 3.66 MIL/uL — ABNORMAL LOW (ref 4.22–5.81)
RDW: 13.2 % (ref 11.5–15.5)
WBC: 17.8 10*3/uL — ABNORMAL HIGH (ref 4.0–10.5)
nRBC: 0 % (ref 0.0–0.2)

## 2019-08-28 LAB — BASIC METABOLIC PANEL
Anion gap: 10 (ref 5–15)
BUN: 23 mg/dL (ref 8–23)
CO2: 23 mmol/L (ref 22–32)
Calcium: 8.9 mg/dL (ref 8.9–10.3)
Chloride: 103 mmol/L (ref 98–111)
Creatinine, Ser: 0.99 mg/dL (ref 0.61–1.24)
GFR calc Af Amer: >60 mL/min (ref >60)
GFR calc non Af Amer: >60 mL/min (ref >60)
Glucose, Bld: 228 mg/dL — ABNORMAL HIGH (ref 70–99)
Potassium: 4.6 mmol/L (ref 3.5–5.1)
Sodium: 136 mmol/L (ref 135–145)

## 2019-08-28 LAB — GLUCOSE, CAPILLARY
Glucose-Capillary: 188 mg/dL — ABNORMAL HIGH (ref 70–99)
Glucose-Capillary: 233 mg/dL — ABNORMAL HIGH (ref 70–99)

## 2019-08-28 MED ORDER — ASPIRIN 81 MG PO CHEW: 81.0000 mg | CHEWABLE_TABLET | Freq: Two times a day (BID) | ORAL | 0 refills | Status: AC

## 2019-08-28 MED ORDER — CELECOXIB 200 MG PO CAPS: 200.0000 mg | ORAL_CAPSULE | Freq: Every day | ORAL | 3 refills | Status: DC

## 2019-08-28 MED ORDER — SENNA 8.6 MG PO TABS: 1.0000 | ORAL_TABLET | Freq: Two times a day (BID) | ORAL | 0 refills | Status: DC

## 2019-08-28 MED ORDER — ONDANSETRON HCL 4 MG PO TABS: 4.0000 mg | ORAL_TABLET | Freq: Four times a day (QID) | ORAL | 0 refills | Status: DC | PRN

## 2019-08-28 MED ORDER — DOCUSATE SODIUM 100 MG PO CAPS: 100.0000 mg | ORAL_CAPSULE | Freq: Two times a day (BID) | ORAL | 1 refills | Status: DC

## 2019-08-28 MED ORDER — HYDROCODONE-ACETAMINOPHEN 5-325 MG PO TABS: 1.0000 | ORAL_TABLET | ORAL | 0 refills | Status: DC | PRN

## 2019-08-28 NOTE — Progress Notes (Signed)
Physical Therapy Treatment Patient Details Name: Gary Lopez MRN: [id number] DOB: 1951/11/06 Today's Date: 08/28/2019    History of Present Illness 68 y.o. patient s/p L THR diect ant approach on 08/27/19. He has PMH significant for DM, HTN, arthritis, and past Rt wrist surgery.    PT Comments    POD # 1 am session Pt OOB in recliner eager for D/C.  Assisted with amb a greater distance in hallway.  Practiced 2 steps to enter his home.  Then returned to room to perform some TE's following HEP handout.  Instructed on proper tech, freq as well as use of ICE.   Addressed all mobility questions, discussed appropriate activity, educated on use of ICE.  Pt ready for D/C to home.   Follow Up Recommendations  Follow surgeon's recommendation for DC plan and follow-up therapies     Equipment Recommendations  None recommended by PT    Recommendations for Other Services       Precautions / Restrictions Precautions Precautions: Fall Restrictions Other Position/Activity Restrictions: WBAT    Mobility  Bed Mobility               General bed mobility comments: OOB in recliner  Transfers Overall transfer level: Needs assistance Equipment used: Rolling walker (2 wheeled) Transfers: Sit to/from Stand Sit to Stand: Min guard;Supervision         General transfer comment: cues required for safe hand placement on RW and for safety as pt is eager to mobilize  Ambulation/Gait Ambulation/Gait assistance: Min Gaffer (Feet): 132 Feet Assistive device: Rolling walker (2 wheeled) Gait Pattern/deviations: Step-through pattern;Decreased stride length;Decreased weight shift to left Gait velocity: decreased   General Gait Details: <25% VC's on safety with turns and proper walker to self distance   Stairs Stairs: Yes Stairs assistance: Min guard;Supervision Stair Management: Two rails;Step to pattern;Forwards Number of Stairs: 2 General stair comments: 25%  VC's on proper sequencing and safety   Wheelchair Mobility    Modified Rankin (Stroke Patients Only)       Balance                                            Cognition Arousal/Alertness: Awake/alert Behavior During Therapy: WFL for tasks assessed/performed Overall Cognitive Status: Within Functional Limits for tasks assessed                                        Exercises   Total Hip Replacement TE's 10 reps ankle pumps 10 reps knee presses 10 reps heel slides 10 reps SAQ's 10 reps ABD 10 reps all standing TE's  Followed by ICE     General Comments        Pertinent Vitals/Pain Pain Assessment: Faces Faces Pain Scale: Hurts a little bit Pain Location: Lt hip Pain Descriptors / Indicators: Aching Pain Intervention(s): Monitored during session;Repositioned;Ice applied    Home Living                      Prior Function            PT Goals (current goals can now be found in the care plan section)      Frequency    7X/week      PT Plan Current plan remains  appropriate    Co-evaluation              AM-PAC PT "6 Clicks" Mobility   Outcome Measure  Help needed turning from your back to your side while in a flat bed without using bedrails?: None Help needed moving from lying on your back to sitting on the side of a flat bed without using bedrails?: A Little Help needed moving to and from a bed to a chair (including a wheelchair)?: A Little Help needed standing up from a chair using your arms (e.g., wheelchair or bedside chair)?: A Little Help needed to walk in hospital room?: A Little   6 Click Score: 16    End of Session Equipment Utilized During Treatment: Gait belt Activity Tolerance: Patient tolerated treatment well Patient left: in chair;with call bell/phone within reach;with chair alarm set;with family/visitor present Nurse Communication: Mobility status(pt ready for D/C to home) PT Visit  Diagnosis: Other abnormalities of gait and mobility (R26.89);Unsteadiness on feet (R26.81);Muscle weakness (generalized) (M62.81);Difficulty in walking, not elsewhere classified (R26.2)     Time:  - 941 - 1007     Charges:    1 gt   1 te                    Rica Koyanagi  PTA Acute  Rehabilitation Services Pager      [phone] Office      [phone]

## 2019-08-28 NOTE — Discharge Summary (Signed)
Physician Discharge Summary  Patient ID: Gary Lopez MRN: [id number] DOB/AGE: August 09, 1951 68 y.o.  Admit date: 08/27/2019 Discharge date: 08/28/2019  Admission Diagnoses:  Osteoarthritis of left hip  Discharge Diagnoses:  Principal Problem:   Osteoarthritis of left hip   Past Medical History:  Diagnosis Date  . Abnormal EKG    left bundle branch blcok ekg 06-24-2019 saw dr Domenic Polite cardiology for  . Arthritis    oa  . Heart murmur   . History of pneumonia    20 years ago  . Hypertension   . Type 2 diabetes mellitus ([place])    Diet controlled    Surgeries: Procedure(s): TOTAL HIP ARTHROPLASTY ANTERIOR APPROACH on 08/27/2019   Consultants (if any):   Discharged Condition: Improved  Hospital Course: SHELDEN GARGUILO is an 68 y.o. male who was admitted 08/27/2019 with a diagnosis of Osteoarthritis of left hip and went to the operating room on 08/27/2019 and underwent the above named procedures.    He was given perioperative antibiotics:  Anti-infectives (From admission, onward)   Start     Dose/Rate Route Frequency Ordered Stop   08/27/19 1400  ceFAZolin (ANCEF) IVPB 2g/100 mL premix     2 g 200 mL/hr over 30 Minutes Intravenous Every 6 hours 08/27/19 1234 08/27/19 2125   08/27/19 0600  ceFAZolin (ANCEF) IVPB 2g/100 mL premix     2 g 200 mL/hr over 30 Minutes Intravenous On call to O.R. 08/27/19 [id number] 08/27/19 0737    .  He was given sequential compression devices, early ambulation, and ASA for DVT prophylaxis.  He benefited maximally from the hospital stay and there were no complications.    Recent vital signs:  Vitals:   08/28/19 0153 08/28/19 0616  BP: 136/83 (!) 155/85  Pulse: 98 (!) 104  Resp:  17  Temp: 97.7 F (36.5 C) 97.8 F (36.6 C)  SpO2: 97% 98%    Recent laboratory studies:  Lab Results  Component Value Date   HGB 11.3 (L) 08/28/2019   HGB 15.6 08/26/2019   HGB 14.3 03/09/2019   Lab Results  Component Value Date   WBC 17.8 (H) 08/28/2019   PLT 219 08/28/2019   Lab Results  Component Value Date   INR 1.0 08/27/2019   Lab Results  Component Value Date   NA 136 08/28/2019   K 4.6 08/28/2019   CL 103 08/28/2019   CO2 23 08/28/2019   BUN 23 08/28/2019   CREATININE 0.99 08/28/2019   GLUCOSE 228 (H) 08/28/2019    Discharge Medications:   Allergies as of 08/28/2019   No Known Allergies     Medication List    STOP taking these medications   acetaminophen 650 MG CR tablet Commonly known as: TYLENOL     TAKE these medications   amLODipine 10 MG tablet Commonly known as: NORVASC Take 1 tablet (10 mg total) by mouth daily.   aspirin 81 MG chewable tablet Chew 1 tablet (81 mg total) by mouth 2 (two) times daily.   atorvastatin 40 MG tablet Commonly known as: Lipitor Take 1 tablet (40 mg total) by mouth daily.   celecoxib 200 MG capsule Commonly known as: CELEBREX Take 1 capsule (200 mg total) by mouth daily.   docusate sodium 100 MG capsule Commonly known as: COLACE Take 1 capsule (100 mg total) by mouth 2 (two) times daily.   HYDROcodone-acetaminophen 5-325 MG tablet Commonly known as: NORCO/VICODIN Take 1 tablet by mouth every 4 (four) hours as needed for moderate pain (  pain score 4-6).   losartan 50 MG tablet Commonly known as: COZAAR Take 1 tablet (50 mg total) by mouth daily.   ondansetron 4 MG tablet Commonly known as: ZOFRAN Take 1 tablet (4 mg total) by mouth every 6 (six) hours as needed for nausea.   senna 8.6 MG Tabs tablet Commonly known as: SENOKOT Take 1 tablet (8.6 mg total) by mouth 2 (two) times daily.       Diagnostic Studies: Dg Pelvis Portable  Result Date: 08/27/2019 CLINICAL DATA:  Status post left hip replacement. EXAM: PORTABLE PELVIS 1-2 VIEWS COMPARISON:  Fluoroscopic images of same day. FINDINGS: The left acetabular and femoral components appear to be well situated. Expected postoperative changes are seen in the surrounding soft tissues. No fracture or dislocation is  noted. IMPRESSION: Status post left total hip arthroplasty. Electronically Signed   By: Marijo Conception M.D.   On: 08/27/2019 11:00   Nm Myocar Multi W/spect W/wall Motion / Ef  Result Date: 08/04/2019  Defect 1: There is a medium defect of moderate severity present in the mid inferoseptal, mid inferior, apical septal and apical inferior location. There appears to be a significant amount of soft tissue attenuation. There is also reverse redistribution seen. There do not appear to be any large ischemic territories.  This is an intermediate risk study based on calculated LVEF.  Nuclear stress EF: 39%. However, LV systolic function appears grossly normal. I would recommend correlating with an echocardiogram.  Left bundle branch block seen throughout study.    Dg C-arm 1-60 Min-no Report  Result Date: 08/27/2019 Fluoroscopy was utilized by the requesting physician.  No radiographic interpretation.   Dg Hip Operative Unilat W Or W/o Pelvis Left  Result Date: 08/27/2019 CLINICAL DATA:  Left hip arthroplasty. EXAM: OPERATIVE left HIP (WITH PELVIS IF PERFORMED) to VIEWS TECHNIQUE: Fluoroscopic spot image(s) were submitted for interpretation post-operatively. COMPARISON:  11/24/2018 FINDINGS: Two intraoperative views of the left hip demonstrate interval placement of left total hip arthroplasty with prosthetic components intact and normally located. Remainder the exam is unchanged. Recommend correlation with findings at the time of the procedure. 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 Electronically Signed   By: Marin Olp M.D.   On: 08/27/2019 11:07    Disposition: Discharge disposition: 01-Home or Self Care       Discharge Instructions    Call MD / Call 911   Complete by: As directed    If you experience chest pain or shortness of breath, CALL 911 and be transported to the hospital emergency room.  If you develope a fever above 101 F, pus (white drainage) or increased  drainage or redness at the wound, or calf pain, call your surgeon's office.   Constipation Prevention   Complete by: As directed    Drink plenty of fluids.  Prune juice may be helpful.  You may use a stool softener, such as Colace (over the counter) 100 mg twice a day.  Use MiraLax (over the counter) for constipation as needed.   Diet - low sodium heart healthy   Complete by: As directed    Driving restrictions   Complete by: As directed    No driving for 2 weeks   Increase activity slowly as tolerated   Complete by: As directed    Lifting restrictions   Complete by: As directed    No lifting for 6 weeks   TED hose   Complete by: As directed    Use stockings (TED  hose) for 2 weeks on both leg(s).  You may remove them at night for sleeping.      Follow-up Information    Sheneika Walstad, Aaron Edelman, MD. Schedule an appointment as soon as possible for a visit in 2 weeks.   Specialty: Orthopedic Surgery Why: For wound re-check Contact information: [address] [place] [address] [id number]            Signed: Hilton Cork Jovonna Nickell 08/28/2019, 1:15 PM

## 2019-08-28 NOTE — Progress Notes (Signed)
    Subjective:  Patient reports pain as mild to moderate.  Denies N/V/CP/SOB.   Objective:   VITALS:   Vitals:   08/27/19 1801 08/27/19 1931 08/28/19 0153 08/28/19 0616  BP: 113/75 122/78 136/83 (!) 155/85  Pulse: 91 94 98 (!) 104  Resp: 15 18  17   Temp: 98.1 F (36.7 C) 98.4 F (36.9 C) 97.7 F (36.5 C) 97.8 F (36.6 C)  TempSrc: Oral Oral  Oral  SpO2: 96% 98% 97% 98%  Weight:      Height:        NAD ABD soft Sensation intact distally Intact pulses distally Dorsiflexion/Plantar flexion intact Incision: dressing C/D/I Compartment soft   Lab Results  Component Value Date   WBC 17.8 (H) 08/28/2019   HGB 11.3 (L) 08/28/2019   HCT 32.7 (L) 08/28/2019   MCV 89.3 08/28/2019   PLT 219 08/28/2019   BMET    Component Value Date/Time   NA 136 08/28/2019 0249   NA 139 03/09/2019 1629   K 4.6 08/28/2019 0249   CL 103 08/28/2019 0249   CO2 23 08/28/2019 0249   GLUCOSE 228 (H) 08/28/2019 0249   BUN 23 08/28/2019 0249   BUN 22 03/09/2019 1629   CREATININE 0.99 08/28/2019 0249   CALCIUM 8.9 08/28/2019 0249   GFRNONAA >60 08/28/2019 0249   GFRAA >60 08/28/2019 0249     Assessment/Plan: 1 Day Post-Op   Principal Problem:   Osteoarthritis of left hip   WBAT with walker DVT ppx: Aspirin, SCDs, TEDS PO pain control PT/OT Dispo: D/C home with HEP   Hilton Cork Zaniya Mcaulay 08/28/2019, 1:10 PM   Rod Can, MD Cell: [phone] [place] [facility] now [facility]  [place] [address], [place], [place] [address] Phone: [phone] [email or web address] Facebook  [employer]

## 2019-09-11 DIAGNOSIS — Z471 Aftercare following joint replacement surgery: Secondary | ICD-10-CM | POA: Diagnosis not present

## 2019-09-11 DIAGNOSIS — Z96642 Presence of left artificial hip joint: Secondary | ICD-10-CM | POA: Diagnosis not present

## 2019-09-12 DIAGNOSIS — Z7982 Long term (current) use of aspirin: Secondary | ICD-10-CM | POA: Diagnosis not present

## 2019-09-12 DIAGNOSIS — R040 Epistaxis: Secondary | ICD-10-CM | POA: Diagnosis not present

## 2019-09-23 DIAGNOSIS — Z7982 Long term (current) use of aspirin: Secondary | ICD-10-CM | POA: Diagnosis not present

## 2019-09-23 DIAGNOSIS — R040 Epistaxis: Secondary | ICD-10-CM | POA: Diagnosis not present

## 2019-09-23 DIAGNOSIS — I10 Essential (primary) hypertension: Secondary | ICD-10-CM | POA: Diagnosis not present

## 2019-09-25 DIAGNOSIS — R040 Epistaxis: Secondary | ICD-10-CM | POA: Diagnosis not present

## 2019-09-25 DIAGNOSIS — Z4800 Encounter for change or removal of nonsurgical wound dressing: Secondary | ICD-10-CM | POA: Diagnosis not present

## 2019-09-27 DIAGNOSIS — R040 Epistaxis: Secondary | ICD-10-CM | POA: Diagnosis not present

## 2019-09-27 DIAGNOSIS — Z4800 Encounter for change or removal of nonsurgical wound dressing: Secondary | ICD-10-CM | POA: Diagnosis not present

## 2019-10-12 DIAGNOSIS — Z96642 Presence of left artificial hip joint: Secondary | ICD-10-CM | POA: Diagnosis not present

## 2019-10-12 DIAGNOSIS — Z471 Aftercare following joint replacement surgery: Secondary | ICD-10-CM | POA: Diagnosis not present

## 2019-12-25 ENCOUNTER — Encounter: Payer: Self-pay | Admitting: Family Medicine

## 2020-02-09 ENCOUNTER — Telehealth: Payer: Self-pay | Admitting: Family Medicine

## 2020-02-09 NOTE — Chronic Care Management (AMB) (Signed)
  Chronic Care Management   Outreach Note  02/09/2020 Name: Gary Lopez MRN: [id number] DOB: 1951-03-19  Gary Lopez is a 69 y.o. year old male who is a primary care patient of Janora Norlander, DO. I reached out to [name] by phone today in response to a referral sent by [name] health plan.     Spoke with patient and he advised to call back to speak with wife Gary Lopez. The patient was referred to the case management team for assistance with care management and care coordination.   Follow Up Plan: The care management team will reach out to the patients wife again over the next 7 days. If patient returns call to provider office, please advise to call [place] at [phone].  Noreene Larsson, [place], [place], [place] [address] Direct Dial: [phone] [email or web address] Website: [email or web address]

## 2020-02-11 NOTE — Chronic Care Management (AMB) (Signed)
  Chronic Care Management   Outreach Note  02/11/2020 Name: Gary Lopez MRN: [id number] DOB: 1951-03-17  Gary Lopez is a 69 y.o. year old male who is a primary care patient of Janora Norlander, DO. I reached out to [name] by phone today in response to a referral sent by [name].     A second unsuccessful telephone outreach was attempted today. The patient was referred to the case management team for assistance with care management and care coordination.   Follow Up Plan: A HIPPA compliant phone message was left for the patient providing contact information and requesting a return call.  The care management team will reach out to the patient again over the next 7 days.  If patient returns call to provider office, please advise to call [place] at [phone].  Noreene Larsson, [place], [place], [place] [address] Direct Dial: [phone] [email or web address]: [email or web address]

## 2020-02-16 NOTE — Chronic Care Management (AMB) (Signed)
  Chronic Care Management   Outreach Note  02/16/2020 Name: STEEN KICK MRN: [id number] DOB: Jul 24, 1951  BENJMAN FREMONT is a 69 y.o. year old male who is a primary care patient of Janora Norlander, DO. I reached out to [name] by phone today in response to a referral sent by Mr. Vihaan Callands [facility] health plan.     Third unsuccessful telephone outreach was attempted today. The patient was referred to the case management team for assistance with care management and care coordination. The patient's primary care provider has been notified of our unsuccessful attempts to make or maintain contact with the patient. The care management team is pleased to engage with this patient at any time in the future should he/she be interested in assistance from the care management team.   Follow Up Plan: A HIPPA compliant phone message was left for the patient providing contact information and requesting a return call.  The care management team is available to follow up with the patient after provider conversation with the patient regarding recommendation for care management engagement and subsequent re-referral to the care management team.   Noreene Larsson, [place], [place], [place] [address] Direct Dial: [phone] [email or web address] Website: [email or web address]

## 2020-02-17 ENCOUNTER — Telehealth: Payer: Self-pay | Admitting: Family Medicine

## 2020-02-17 NOTE — Chronic Care Management (AMB) (Signed)
  Chronic Care Management   Note  02/17/2020 Name: Gary Lopez MRN: [id number] DOB: 1951/01/03  Gary Lopez is a 69 y.o. year old male who is a primary care patient of Janora Norlander, DO. I reached out to [name] by phone today in response to a referral sent by Gary Lopez [facility] health plan.     Mr. Shaff was given information about Chronic Care Management services today including:  1. CCM service includes personalized support from designated clinical staff supervised by his physician, including individualized plan of care and coordination with other care providers 2. 24/7 contact phone numbers for assistance for urgent and routine care needs. 3. Service will only be billed when office clinical staff spend 20 minutes or more in a month to coordinate care. 4. Only one practitioner may furnish and bill the service in a calendar month. 5. The patient may stop CCM services at any time (effective at the end of the month) by phone call to the office staff. 6. The patient will be responsible for cost sharing (co-pay) of up to 20% of the service fee (after annual deductible is met).  Patient agreed to services and verbal consent obtained.   Follow up plan: Telephone appointment with care management team member scheduled for: 05/18/2020  Noreene Larsson, [place], [place], [place] [address] Direct Dial: [phone] [email or web address]: [email or web address]

## 2020-02-22 ENCOUNTER — Other Ambulatory Visit: Payer: Self-pay

## 2020-02-22 ENCOUNTER — Encounter: Payer: Self-pay | Admitting: Family Medicine

## 2020-02-22 ENCOUNTER — Ambulatory Visit (INDEPENDENT_AMBULATORY_CARE_PROVIDER_SITE_OTHER): Payer: Medicare Other | Admitting: Family Medicine

## 2020-02-22 ENCOUNTER — Telehealth: Payer: Self-pay | Admitting: Family Medicine

## 2020-02-22 VITALS — BP 158/82 | HR 101 | Temp 99.3°F | Ht 68.0 in | Wt 223.6 lb

## 2020-02-22 DIAGNOSIS — I10 Essential (primary) hypertension: Secondary | ICD-10-CM | POA: Diagnosis not present

## 2020-02-22 DIAGNOSIS — E1169 Type 2 diabetes mellitus with other specified complication: Secondary | ICD-10-CM

## 2020-02-22 DIAGNOSIS — D649 Anemia, unspecified: Secondary | ICD-10-CM | POA: Diagnosis not present

## 2020-02-22 DIAGNOSIS — E1165 Type 2 diabetes mellitus with hyperglycemia: Secondary | ICD-10-CM | POA: Diagnosis not present

## 2020-02-22 DIAGNOSIS — Z1159 Encounter for screening for other viral diseases: Secondary | ICD-10-CM | POA: Diagnosis not present

## 2020-02-22 DIAGNOSIS — E119 Type 2 diabetes mellitus without complications: Secondary | ICD-10-CM | POA: Diagnosis not present

## 2020-02-22 DIAGNOSIS — E1159 Type 2 diabetes mellitus with other circulatory complications: Secondary | ICD-10-CM

## 2020-02-22 DIAGNOSIS — E785 Hyperlipidemia, unspecified: Secondary | ICD-10-CM

## 2020-02-22 LAB — BAYER DCA HB A1C WAIVED: HB A1C (BAYER DCA - WAIVED): 10.8 % — ABNORMAL HIGH (ref <7.0)

## 2020-02-22 MED ORDER — ATORVASTATIN CALCIUM 40 MG PO TABS: 40.0000 mg | ORAL_TABLET | Freq: Every day | ORAL | 3 refills | Status: DC

## 2020-02-22 MED ORDER — AMLODIPINE BESYLATE 10 MG PO TABS: 10.0000 mg | ORAL_TABLET | Freq: Every day | ORAL | 3 refills | Status: DC

## 2020-02-22 MED ORDER — LOSARTAN POTASSIUM 50 MG PO TABS: 50.0000 mg | ORAL_TABLET | Freq: Every day | ORAL | 3 refills | Status: DC

## 2020-02-22 MED ORDER — METFORMIN HCL 500 MG PO TABS: ORAL_TABLET | ORAL | 0 refills | Status: DC

## 2020-02-22 NOTE — Telephone Encounter (Signed)
Patient wife aware and verbalized understanding metformin was not taken of market

## 2020-02-22 NOTE — Progress Notes (Signed)
Subjective: CC: HTN PCP: Janora Norlander, DO Gary Lopez is a 69 y.o. male presenting to clinic today for:  1. Type 2 Diabetes w/ HTN, HLD:  Patient reports compliance with losartan 50 mg daily, Norvasc 10 mg daily and atorvastatin 40 mg daily.  He has been to this point been controlled through diet alone with his blood sugars.  He does not monitor blood sugars.  He notes that he does not restrict his diet.  Diet does consist of carbs including potatoes.  He ate [place] fries today.  Does not check blood pressures at home.  Last eye exam: UTD Last foot exam: UTD Last A1c:  Lab Results  Component Value Date   HGBA1C 7.2 (H) 08/26/2019   Nephropathy screen indicated?: on ARB Last flu, zoster and/or pneumovax: needs PNA Immunization History  Administered Date(s) Administered  . Fluad Quad(high Dose 65+) 10/24/2019  . Pneumococcal Conjugate-13 11/24/2018  . Tdap 11/24/2018    ROS: Denies dizziness, LOC, polyuria, polydipsia, unintended weight loss/gain, foot ulcerations, numbness or tingling in extremities, shortness of breath or chest pain.  Hip pain has improved quite substantially since his hip surgery.   ROS: Per HPI  No Known Allergies Past Medical History:  Diagnosis Date  . Abnormal EKG    left bundle branch blcok ekg 06-24-2019 saw dr Domenic Polite cardiology for  . Arthritis    oa  . Heart murmur   . History of pneumonia    20 years ago  . Hypertension   . Type 2 diabetes mellitus ([place])    Diet controlled    Current Outpatient Medications:  .  amLODipine (NORVASC) 10 MG tablet, Take 1 tablet (10 mg total) by mouth daily., Disp: 90 tablet, Rfl: 3 .  atorvastatin (LIPITOR) 40 MG tablet, Take 1 tablet (40 mg total) by mouth daily., Disp: 90 tablet, Rfl: 3 .  losartan (COZAAR) 50 MG tablet, Take 1 tablet (50 mg total) by mouth daily., Disp: 90 tablet, Rfl: 3 Social History   Socioeconomic History  . Marital status: Married    Spouse name: Not on file  .  Number of children: Not on file  . Years of education: Not on file  . Highest education level: Not on file  Occupational History  . Occupation: Truck Geophysicist/field seismologist  Tobacco Use  . Smoking status: Never Smoker  . Smokeless tobacco: Never Used  Substance and Sexual Activity  . Alcohol use: Not Currently  . Drug use: Not Currently  . Sexual activity: Not Currently  Other Topics Concern  . Not on file  Social History Narrative  . Not on file   Social Determinants of Health   Financial Resource Strain:   . Difficulty of Paying Living Expenses:   Food Insecurity:   . Worried About Charity fundraiser in the Last Year:   . Arboriculturist in the Last Year:   Transportation Needs:   . Film/video editor (Medical):   Marland Kitchen Lack of Transportation (Non-Medical):   Physical Activity:   . Days of Exercise per Week:   . Minutes of Exercise per Session:   Stress:   . Feeling of Stress :   Social Connections:   . Frequency of Communication with Friends and Family:   . Frequency of Social Gatherings with Friends and Family:   . Attends Religious Services:   . Active Member of Clubs or Organizations:   . Attends Archivist Meetings:   Marland Kitchen Marital Status:   Intimate  Partner Violence:   . Fear of Current or Ex-Partner:   . Emotionally Abused:   Marland Kitchen Physically Abused:   . Sexually Abused:    Family History  Problem Relation Age of Onset  . Heart disease Mother   . COPD Father     Objective: Office vital signs reviewed. BP (!) 158/82   Pulse (!) 101   Temp 99.3 F (37.4 C) (Temporal)   Ht 5\' 8"  (1.727 m)   Wt 223 lb 9.6 oz (101.4 kg)   SpO2 97%   BMI 34.00 kg/m   Physical Examination:  General: Awake, alert, well appearing, No acute distress Cardio: regular rate and rhythm, S1S2 heard, no murmurs appreciated Pulm: clear to auscultation bilaterally, no wheezes, rhonchi or rales; normal work of breathing on room air Extremities: warm, well perfused, No edema, cyanosis or  clubbing; +2 pulses bilaterally  Assessment/ Plan: 69 y.o. male   1. New onset type 2 diabetes mellitus (HCC) Grossly uncontrolled.  A1c up from 7.2-10.8 today.  I have added Metformin and discussed titration regimen.  I offered referral to diabetic nutritionist but patient declined.  I reinforced need for reduction in carbohydrates.  Patient was good understanding. - Bayer DCA Hb A1c Waived - metFORMIN (GLUCOPHAGE) 500 MG tablet; Take 1 tablet (500 mg total) by mouth daily with breakfast for 28 days, THEN 1 tablet (500 mg total) 2 (two) times daily with a meal for 28 days, THEN 2 tablets (1,000 mg total) 2 (two) times daily with a meal for 28 days.  Dispense: 196 tablet; Refill: 0 - Basic Metabolic Panel  2. Hypertension associated with diabetes ([place]) Not controlled.  Recommend recheck in 2 weeks.  If persistently elevated, plan to increase losartan to 100 mg daily - amLODipine (NORVASC) 10 MG tablet; Take 1 tablet (10 mg total) by mouth daily.  Dispense: 90 tablet; Refill: 3 - Basic Metabolic Panel  3. Hyperlipidemia due to type 2 diabetes mellitus (HCC) - atorvastatin (LIPITOR) 40 MG tablet; Take 1 tablet (40 mg total) by mouth daily.  Dispense: 90 tablet; Refill: 3  4. Anemia, unspecified type - CBC  5. Encounter for hepatitis C screening test for low risk patient - Hepatitis C antibody   Orders Placed This Encounter  Procedures  . Bayer DCA Hb A1c Waived  . Hepatitis C antibody  . CBC  . Basic Metabolic Panel   Meds ordered this encounter  Medications  . amLODipine (NORVASC) 10 MG tablet    Sig: Take 1 tablet (10 mg total) by mouth daily.    Dispense:  90 tablet    Refill:  3  . atorvastatin (LIPITOR) 40 MG tablet    Sig: Take 1 tablet (40 mg total) by mouth daily.    Dispense:  90 tablet    Refill:  3  . losartan (COZAAR) 50 MG tablet    Sig: Take 1 tablet (50 mg total) by mouth daily.    Dispense:  90 tablet    Refill:  [place], [place] [phone]

## 2020-02-22 NOTE — Patient Instructions (Signed)
I have ordered metformin for you to start for your blood sugar.  Apparently you were found to be a diabetic when you were checked at the hospital during your preop for your recent surgery.  Your sugar has gone up substantially since that time.  I would like to see you back in 3 months for recheck

## 2020-02-23 LAB — BASIC METABOLIC PANEL
BUN/Creatinine Ratio: 18 (ref 10–24)
BUN: 19 mg/dL (ref 8–27)
CO2: 24 mmol/L (ref 20–29)
Calcium: 9.4 mg/dL (ref 8.6–10.2)
Chloride: 99 mmol/L (ref 96–106)
Creatinine, Ser: 1.03 mg/dL (ref 0.76–1.27)
GFR calc Af Amer: 85 mL/min/{1.73_m2} (ref >59)
GFR calc non Af Amer: 74 mL/min/{1.73_m2} (ref >59)
Glucose: 457 mg/dL (ref 65–99)
Potassium: 4.8 mmol/L (ref 3.5–5.2)
Sodium: 138 mmol/L (ref 134–144)

## 2020-02-23 LAB — CBC
Hematocrit: 44.5 % (ref 37.5–51.0)
Hemoglobin: 15.3 g/dL (ref 13.0–17.7)
MCH: 29.5 pg (ref 26.6–33.0)
MCHC: 34.4 g/dL (ref 31.5–35.7)
MCV: 86 fL (ref 79–97)
Platelets: 232 10*3/uL (ref 150–450)
RBC: 5.18 x10E6/uL (ref 4.14–5.80)
RDW: 14.3 % (ref 11.6–15.4)
WBC: 7.3 10*3/uL (ref 3.4–10.8)

## 2020-02-23 LAB — HEPATITIS C ANTIBODY: Hep C Virus Ab: <0.1 s/co ratio (ref 0.0–0.9)

## 2020-02-24 DIAGNOSIS — Z96649 Presence of unspecified artificial hip joint: Secondary | ICD-10-CM | POA: Insufficient documentation

## 2020-02-26 DIAGNOSIS — Z96642 Presence of left artificial hip joint: Secondary | ICD-10-CM | POA: Diagnosis not present

## 2020-02-26 DIAGNOSIS — Z471 Aftercare following joint replacement surgery: Secondary | ICD-10-CM | POA: Diagnosis not present

## 2020-03-07 ENCOUNTER — Ambulatory Visit: Payer: Medicare Other

## 2020-03-07 ENCOUNTER — Other Ambulatory Visit: Payer: Self-pay

## 2020-03-07 VITALS — BP 135/87 | HR 93

## 2020-03-07 DIAGNOSIS — E1159 Type 2 diabetes mellitus with other circulatory complications: Secondary | ICD-10-CM

## 2020-03-07 DIAGNOSIS — I152 Hypertension secondary to endocrine disorders: Secondary | ICD-10-CM

## 2020-03-07 NOTE — Progress Notes (Signed)
Patient here today to have blood pressure checked.  Blood pressure reading today is 135/87, pulse 93.

## 2020-03-09 ENCOUNTER — Encounter: Payer: Self-pay | Admitting: *Deleted

## 2020-03-17 ENCOUNTER — Ambulatory Visit: Payer: Medicare Other | Attending: Internal Medicine

## 2020-03-17 DIAGNOSIS — Z23 Encounter for immunization: Secondary | ICD-10-CM

## 2020-03-17 NOTE — Progress Notes (Signed)
   Covid-19 Vaccination Clinic  Name:  Gary Lopez    MRN: [id number] DOB: 1951-05-23  03/17/2020  Gary Lopez was observed post Covid-19 immunization for 15 minutes without incident. He was provided with Vaccine Information Sheet and instruction to access the V-Safe system.   Gary Lopez was instructed to call 911 with any severe reactions post vaccine: Marland Kitchen Difficulty breathing  . Swelling of face and throat  . A fast heartbeat  . A bad rash all over body  . Dizziness and weakness   Immunizations Administered    Name Date Dose VIS Date Route   Moderna COVID-19 Vaccine 03/17/2020  1:49 PM 0.5 mL 11/10/2019 Intramuscular   Manufacturer: Moderna   Lot: [id number]   [place]PO:[id number]

## 2020-04-14 ENCOUNTER — Ambulatory Visit: Payer: Medicare Other | Attending: Internal Medicine

## 2020-04-14 DIAGNOSIS — Z23 Encounter for immunization: Secondary | ICD-10-CM

## 2020-04-14 NOTE — Progress Notes (Signed)
   Covid-19 Vaccination Clinic  Name:  Gary Lopez    MRN: [id number] DOB: 03/15/1951  04/14/2020  Mr. Gary Lopez was observed post Covid-19 immunization for 15 minutes without incident. He was provided with Vaccine Information Sheet and instruction to access the V-Safe system.   Mr. Gary Lopez was instructed to call 911 with any severe reactions post vaccine: Marland Kitchen Difficulty breathing  . Swelling of face and throat  . A fast heartbeat  . A bad rash all over body  . Dizziness and weakness   Immunizations Administered    Name Date Dose VIS Date Route   Moderna COVID-19 Vaccine 04/14/2020  1:48 PM 0.5 mL 11/2019 Intramuscular   Manufacturer: Moderna   Lot: [id number]   [place]VO:[id number]

## 2020-05-18 ENCOUNTER — Ambulatory Visit (INDEPENDENT_AMBULATORY_CARE_PROVIDER_SITE_OTHER): Payer: Medicare Other | Admitting: *Deleted

## 2020-05-18 DIAGNOSIS — I10 Essential (primary) hypertension: Secondary | ICD-10-CM | POA: Diagnosis not present

## 2020-05-18 DIAGNOSIS — E119 Type 2 diabetes mellitus without complications: Secondary | ICD-10-CM | POA: Diagnosis not present

## 2020-05-18 DIAGNOSIS — E1159 Type 2 diabetes mellitus with other circulatory complications: Secondary | ICD-10-CM | POA: Diagnosis not present

## 2020-05-18 NOTE — Patient Instructions (Signed)
Visit Information  Goals Addressed            This Visit's Progress    Chronic Disease Management Needs       CARE PLAN ENTRY (see longtitudinal plan of care for additional care plan information)  Current Barriers:   Chronic Disease Management support, education, and care coordination needs related to HTN, DM, arthritis, HLD  Clinical Goal(s) related to HTN, DM, arthritis, HLD:  Over the next 45 days, patient will:   Work with the care management team to address educational, disease management, and care coordination needs   Begin or continue self health monitoring activities as directed today Measure and record cbg (blood glucose) 1 times daily and Measure and record blood pressure 2 times per week  Call provider office for new or worsened signs and symptoms Blood glucose findings outside established parameters and Blood pressure findings outside established parameters  Call care management team with questions or concerns  Verbalize basic understanding of patient centered plan of care established today  Interventions related to HTN, DM, arthritis, HLD:   Evaluation of current treatment plans and patient's adherence to plan as established by provider  Assessed patient understanding of disease states  Assessed patient's education and care coordination needs  Provided disease specific education to patient   Collaborated with appropriate clinical care team members regarding patient needs  Chart reviewed including recent office notes and lab results  Discussed last A1C   Discussed medications: He would like to d/c metformin if possible. Will talk with PCP.   Discussed daily blood sugar readings: ranging between 110 and 130  Reviewed upcoming appt: 05/24/20 with Dr Lajuana Ripple  Discussed diet and exercise o Walking 50 minutes daily in the woods (encouraged projection against ticks) o Doesn't eat out very often anymore and is watching sugar/carbohydrate intake  Patient  Self Care Activities related to HTN, DM, arthritis, HLD:   Patient is able to independently perform ADLs and IADLs  Initial goal documentation        Gary Lopez was given information about Chronic Care Management services today including:  1. CCM service includes personalized support from designated clinical staff supervised by his physician, including individualized plan of care and coordination with other care providers 2. 24/7 contact phone numbers for assistance for urgent and routine care needs. 3. Service will only be billed when office clinical staff spend 20 minutes or more in a month to coordinate care. 4. Only one practitioner may furnish and bill the service in a calendar month. 5. The patient may stop CCM services at any time (effective at the end of the month) by phone call to the office staff. 6. The patient will be responsible for cost sharing (co-pay) of up to 20% of the service fee (after annual deductible is met).  Patient agreed to services and verbal consent obtained.   The patient verbalized understanding of instructions provided today and declined a print copy of patient instruction materials.   The care management team will reach out to the patient again over the next 05/24/20 days.  Next PCP appointment scheduled for:   Chong Sicilian, BSN, RN-BC [place] [facility] [facility] Direct Dial: [phone]

## 2020-05-18 NOTE — Chronic Care Management (AMB) (Signed)
Chronic Care Management   Initial Visit Note  05/18/2020 Name: Gary Lopez MRN: [id number] DOB: August 04, 1951  Referred by: Gary Norlander, DO Reason for referral : Chronic Care Management (Initial Visit)   Gary Lopez is a 69 y.o. year old male who is a primary care patient of Gary Norlander, DO. The CCM team was consulted for assistance with chronic disease management and care coordination needs related to HTN, DM, arthritis, HLD.  Review of patient status, including review of consultants reports, relevant laboratory and other test results, and collaboration with appropriate care team members and the patient's provider was performed as part of comprehensive patient evaluation and provision of chronic care management services.    Subjective: I spoke with Gary Lopez today regarding management of his chronic medical conditions. He is retired but is active daily. He lives at home with his wife. They have adjusted their eating habits and have decreased how often they eat out and he has cut back on sugar and carbohydrates. He is pleased with his blood sugar readings over the past few months.  SDOH (Social Determinants of Health) assessments performed: Yes See Care Plan activities for detailed interventions related to SDOH    Objective: Outpatient Encounter Medications as of 05/18/2020  Medication Sig  . amLODipine (NORVASC) 10 MG tablet Take 1 tablet (10 mg total) by mouth daily.  Marland Kitchen atorvastatin (LIPITOR) 40 MG tablet Take 1 tablet (40 mg total) by mouth daily.  Marland Kitchen losartan (COZAAR) 50 MG tablet Take 1 tablet (50 mg total) by mouth daily.  . metFORMIN (GLUCOPHAGE) 500 MG tablet Take 1 tablet (500 mg total) by mouth daily with breakfast for 28 days, THEN 1 tablet (500 mg total) 2 (two) times daily with a meal for 28 days, THEN 2 tablets (1,000 mg total) 2 (two) times daily with a meal for 28 days.   No facility-administered encounter medications on file as of 05/18/2020.    Lab Results   Component Value Date   HGBA1C 10.8 (H) 02/22/2020   HGBA1C 7.2 (H) 08/26/2019   HGBA1C 6.6 06/09/2019   Lab Results  Component Value Date   LDLCALC 133 (H) 11/24/2018   CREATININE 1.03 02/22/2020   BP Readings from Last 3 Encounters:  03/07/20 135/87  02/22/20 (!) 158/82  08/28/19 (!) 155/85    RN Care Plan   . Chronic Disease Management Needs       CARE PLAN ENTRY (see longtitudinal plan of care for additional care plan information)  Current Barriers:  . Chronic Disease Management support, education, and care coordination needs related to HTN, DM, arthritis, HLD  Clinical Goal(s) related to HTN, DM, arthritis, HLD:  Over the next 45 days, patient will:  . Work with the care management team to address educational, disease management, and care coordination needs  . Begin or continue self health monitoring activities as directed today Measure and record cbg (blood glucose) 1 times daily and Measure and record blood pressure 2 times per week . Call provider office for new or worsened signs and symptoms Blood glucose findings outside established parameters and Blood pressure findings outside established parameters . Call care management team with questions or concerns . Verbalize basic understanding of patient centered plan of care established today  Interventions related to HTN, DM, arthritis, HLD:  . Evaluation of current treatment plans and patient's adherence to plan as established by provider . Assessed patient understanding of disease states . Assessed patient's education and care coordination needs . Provided disease  specific education to patient  . Collaborated with appropriate clinical care team members regarding patient needs . Chart reviewed including recent office notes and lab results . Discussed last A1C  . Discussed medications: He would like to d/c metformin if possible. Will talk with PCP.  Marland Kitchen Discussed daily blood sugar readings: ranging between 110 and  130 . Reviewed upcoming appt: 05/24/20 with Dr Gary Lopez . Discussed diet and exercise o Walking 50 minutes daily in the woods (encouraged projection against ticks) o Doesn't eat out very often anymore and is watching sugar/carbohydrate intake  Patient Self Care Activities related to HTN, DM, arthritis, HLD:  . Patient is able to independently perform ADLs and IADLs  Initial goal documentation         Plan:   The care management team will reach out to the patient again over the next 05/24/20 days.  Next PCP appointment scheduled for:   Gary Lopez, BSN, RN-BC [place] [facility] [place] [facility] Direct Dial: [phone]

## 2020-05-19 ENCOUNTER — Other Ambulatory Visit: Payer: Self-pay | Admitting: *Deleted

## 2020-05-19 DIAGNOSIS — E1165 Type 2 diabetes mellitus with hyperglycemia: Secondary | ICD-10-CM

## 2020-05-19 NOTE — Telephone Encounter (Signed)
Fax from [facility] for 90d supply on Metformin

## 2020-05-20 MED ORDER — METFORMIN HCL 500 MG PO TABS: 1000.0000 mg | ORAL_TABLET | Freq: Two times a day (BID) | ORAL | 0 refills | Status: DC

## 2020-05-24 ENCOUNTER — Ambulatory Visit (INDEPENDENT_AMBULATORY_CARE_PROVIDER_SITE_OTHER): Payer: Medicare Other | Admitting: Family Medicine

## 2020-05-24 ENCOUNTER — Encounter: Payer: Self-pay | Admitting: Family Medicine

## 2020-05-24 ENCOUNTER — Other Ambulatory Visit: Payer: Self-pay

## 2020-05-24 VITALS — BP 136/81 | HR 86 | Temp 97.7°F | Ht 68.0 in | Wt 207.0 lb

## 2020-05-24 DIAGNOSIS — E1165 Type 2 diabetes mellitus with hyperglycemia: Secondary | ICD-10-CM | POA: Diagnosis not present

## 2020-05-24 DIAGNOSIS — E1169 Type 2 diabetes mellitus with other specified complication: Secondary | ICD-10-CM | POA: Diagnosis not present

## 2020-05-24 DIAGNOSIS — E785 Hyperlipidemia, unspecified: Secondary | ICD-10-CM | POA: Diagnosis not present

## 2020-05-24 DIAGNOSIS — I10 Essential (primary) hypertension: Secondary | ICD-10-CM

## 2020-05-24 DIAGNOSIS — E1159 Type 2 diabetes mellitus with other circulatory complications: Secondary | ICD-10-CM | POA: Diagnosis not present

## 2020-05-24 LAB — BAYER DCA HB A1C WAIVED: HB A1C (BAYER DCA - WAIVED): 6.2 % (ref <7.0)

## 2020-05-24 NOTE — Progress Notes (Signed)
Subjective: CC: f/u DM2, HTN PCP: Janora Norlander, DO NID:POEUM R Schaab is a 69 y.o. male presenting to clinic today for:  1. Type 2 Diabetes w/ HTN, HLD:  Patient reports compliance with losartan 50 mg daily, Norvasc 10 mg daily and atorvastatin 40 mg daily. Metformin added last visit for rapid increase in BGs.  He has been compliant with Metformin 1000 mg nightly.  He has been really working on diet modification and increase physical exercise.  He has successfully lost some weight as a result.  Blood sugars have been fairly well controlled with most being less than 130.  Last eye exam: [facility] done, ROI completed Last foot exam: UTD Last A1c:  Lab Results  Component Value Date   HGBA1C 10.8 (H) 02/22/2020   Nephropathy screen indicated?: on ARB Last flu, zoster and/or pneumovax: needs PNA 23 but declines Immunization History  Administered Date(s) Administered  . Fluad Quad(high Dose 65+) 10/24/2019  . Moderna SARS-COVID-2 Vaccination 03/17/2020, 04/14/2020  . Pneumococcal Conjugate-13 11/24/2018  . Tdap 11/24/2018    ROS: No chest pain, shortness of breath, visual disturbance.  He is getting around easier after his hip surgery.   ROS: Per HPI  No Known Allergies Past Medical History:  Diagnosis Date  . Abnormal EKG    left bundle branch blcok ekg 06-24-2019 saw dr Domenic Polite cardiology for  . Arthritis    oa  . Heart murmur   . History of pneumonia    20 years ago  . Hypertension   . Type 2 diabetes mellitus ([place])    Diet controlled    Current Outpatient Medications:  .  amLODipine (NORVASC) 10 MG tablet, Take 1 tablet (10 mg total) by mouth daily., Disp: 90 tablet, Rfl: 3 .  atorvastatin (LIPITOR) 40 MG tablet, Take 1 tablet (40 mg total) by mouth daily., Disp: 90 tablet, Rfl: 3 .  losartan (COZAAR) 50 MG tablet, Take 1 tablet (50 mg total) by mouth daily., Disp: 90 tablet, Rfl: 3 .  metFORMIN (GLUCOPHAGE) 500 MG tablet, Take 2 tablets (1,000 mg total) by  mouth 2 (two) times daily with a meal for 28 days., Disp: 360 tablet, Rfl: 0 Social History   Socioeconomic History  . Marital status: Married    Spouse name: Not on file  . Number of children: Not on file  . Years of education: Not on file  . Highest education level: Not on file  Occupational History  . Occupation: Truck Geophysicist/field seismologist  Tobacco Use  . Smoking status: Never Smoker  . Smokeless tobacco: Never Used  Vaping Use  . Vaping Use: Never used  Substance and Sexual Activity  . Alcohol use: Not Currently  . Drug use: Not Currently  . Sexual activity: Not Currently  Other Topics Concern  . Not on file  Social History Narrative  . Not on file   Social Determinants of Health   Financial Resource Strain: Low Risk   . Difficulty of Paying Living Expenses: Not hard at all  Food Insecurity: No Food Insecurity  . Worried About Charity fundraiser in the Last Year: Never true  . Ran Out of Food in the Last Year: Never true  Transportation Needs: No Transportation Needs  . Lack of Transportation (Medical): No  . Lack of Transportation (Non-Medical): No  Physical Activity: Sufficiently Active  . Days of Exercise per Week: 7 days  . Minutes of Exercise per Session: 50 min  Stress: No Stress Concern Present  . Feeling of  Stress : Not at all  Social Connections: Moderately Isolated  . Frequency of Communication with Friends and Family: More than three times a week  . Frequency of Social Gatherings with Friends and Family: More than three times a week  . Attends Religious Services: Never  . Active Member of Clubs or Organizations: No  . Attends Archivist Meetings: Never  . Marital Status: Married  Human resources officer Violence: Not At Risk  . Fear of Current or Ex-Partner: No  . Emotionally Abused: No  . Physically Abused: No  . Sexually Abused: No   Family History  Problem Relation Age of Onset  . Heart disease Mother   . COPD Father     Objective: Office vital  signs reviewed. BP 136/81   Pulse 86   Temp 97.7 F (36.5 C) (Temporal)   Ht 5\' 8"  (1.727 m)   Wt 207 lb (93.9 kg)   SpO2 97%   BMI 31.47 kg/m   Physical Examination:  General: Awake, alert, well appearing, No acute distress Cardio: regular rate and rhythm, S1S2 heard, no murmurs appreciated Pulm: clear to auscultation bilaterally, no wheezes, rhonchi or rales; normal work of breathing on room air Extremities: warm, well perfused, No edema, cyanosis or clubbing; +2 pulses bilaterally  Assessment/ Plan: 69 y.o. male   1. Controlled type 2 diabetes mellitus (HCC) A1c down to 6.2.  He is doing excellent.  Okay to reduce Metformin to 1 tablet nightly.  We will see him back in about 4 months.  If he is able to maintain controlled A1c, we can try and take him off of the Metformin totally and allow him to continue with appropriate lifestyle.  He seems very motivated to come off medication if able. - Bayer DCA Hb A1c Waived  2. Hypertension associated with diabetes ([place]) Controlled.  Continue current regimen  3. Hyperlipidemia due to type 2 diabetes mellitus ([place]) Continue statin    No orders of the defined types were placed in this encounter.  No orders of the defined types were placed in this encounter.    Janora Norlander, DO [place] [phone]

## 2020-06-23 ENCOUNTER — Ambulatory Visit: Payer: Medicare Other | Admitting: *Deleted

## 2020-06-23 DIAGNOSIS — E1169 Type 2 diabetes mellitus with other specified complication: Secondary | ICD-10-CM

## 2020-06-23 DIAGNOSIS — E1159 Type 2 diabetes mellitus with other circulatory complications: Secondary | ICD-10-CM

## 2020-06-23 NOTE — Chronic Care Management (AMB) (Signed)
  Chronic Care Management   Follow-up Outreach  06/23/2020 Name: Gary Lopez MRN: [id number] DOB: July 30, 1951  Referred by: Janora Norlander, DO Reason for referral : Chronic Care Management (RN Follow up)   An unsuccessful telephone follow-up was attempted today. The patient was referred to the case management team for assistance with care management and care coordination.   Follow Up Plan: A HIPPA compliant phone message was left for the patient providing contact information and requesting a return call.  The care management team will reach out to the patient again over the next 60 days.   Chong Sicilian, BSN, RN-BC Embedded Chronic Care Manager [facility] [place] [facility] Direct Dial: [phone]

## 2020-07-02 IMAGING — RF DG HIP (WITH PELVIS) OPERATIVE*L*
1 series · 2 of 2 positions shown · non-contrast
Comparison: 11/24/2018

CLINICAL DATA: Left hip arthroplasty.

EXAM:
OPERATIVE left HIP (WITH PELVIS IF PERFORMED) to VIEWS
TECHNIQUE: Fluoroscopic spot image(s) were submitted for interpretation
post-operatively.

[Series 1: unknown protocol · 0.20mm/px · 2 of 2 slices shown]
[im 1/2]
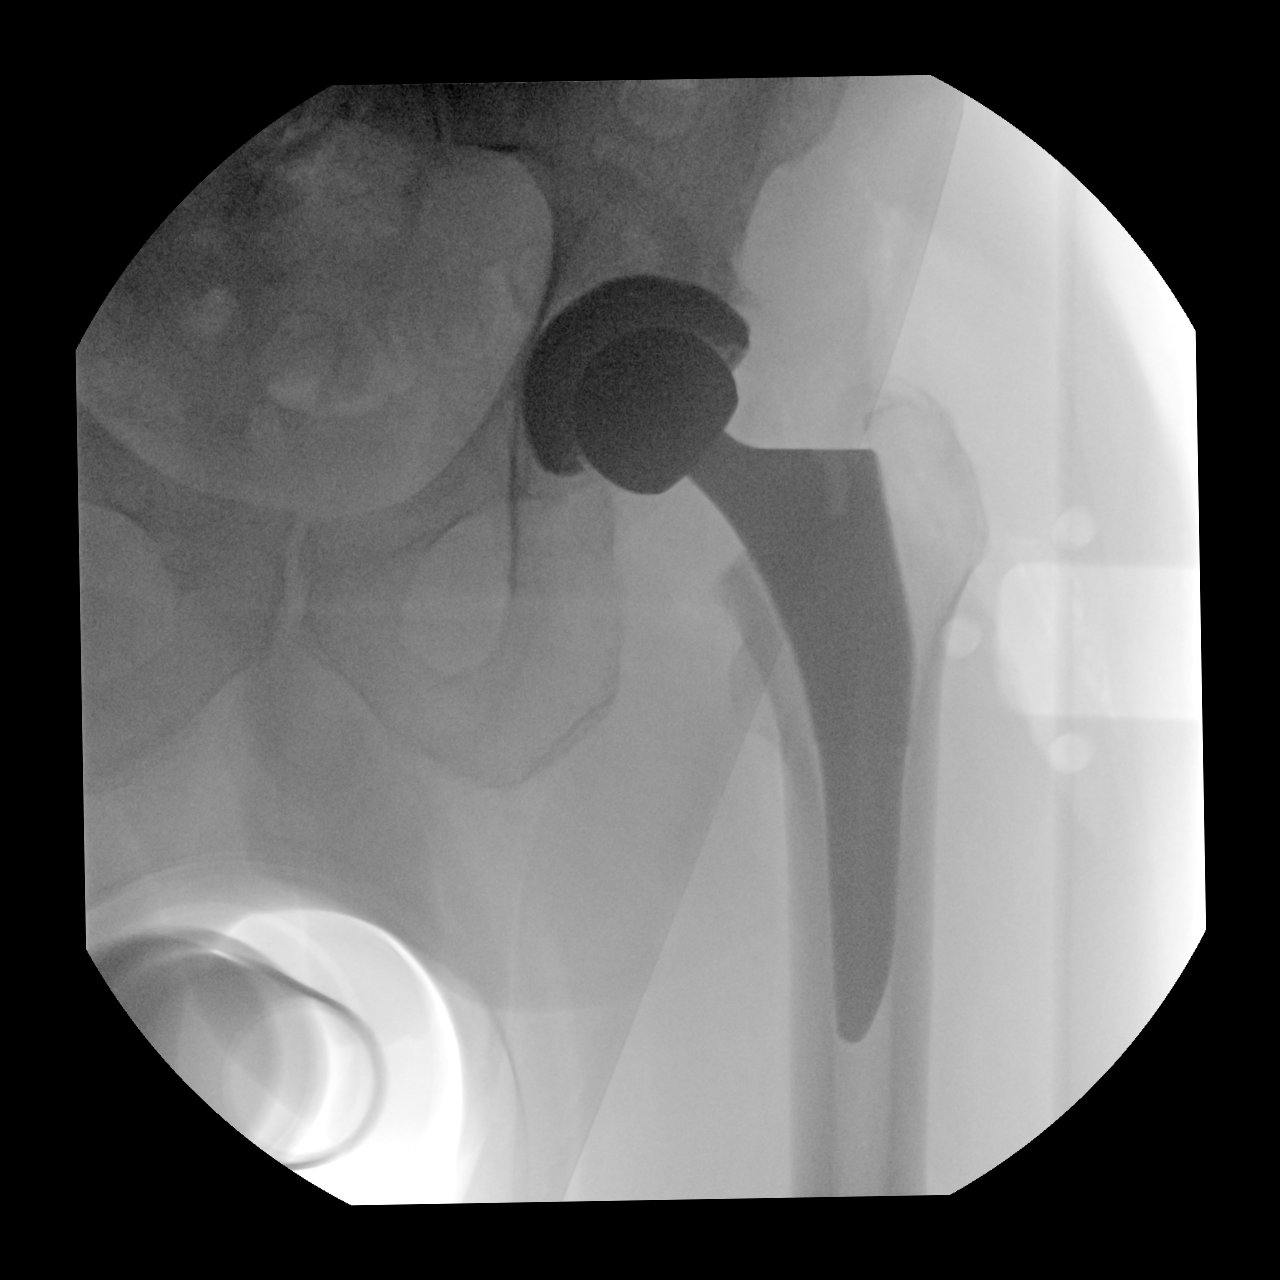
[im 2/2]
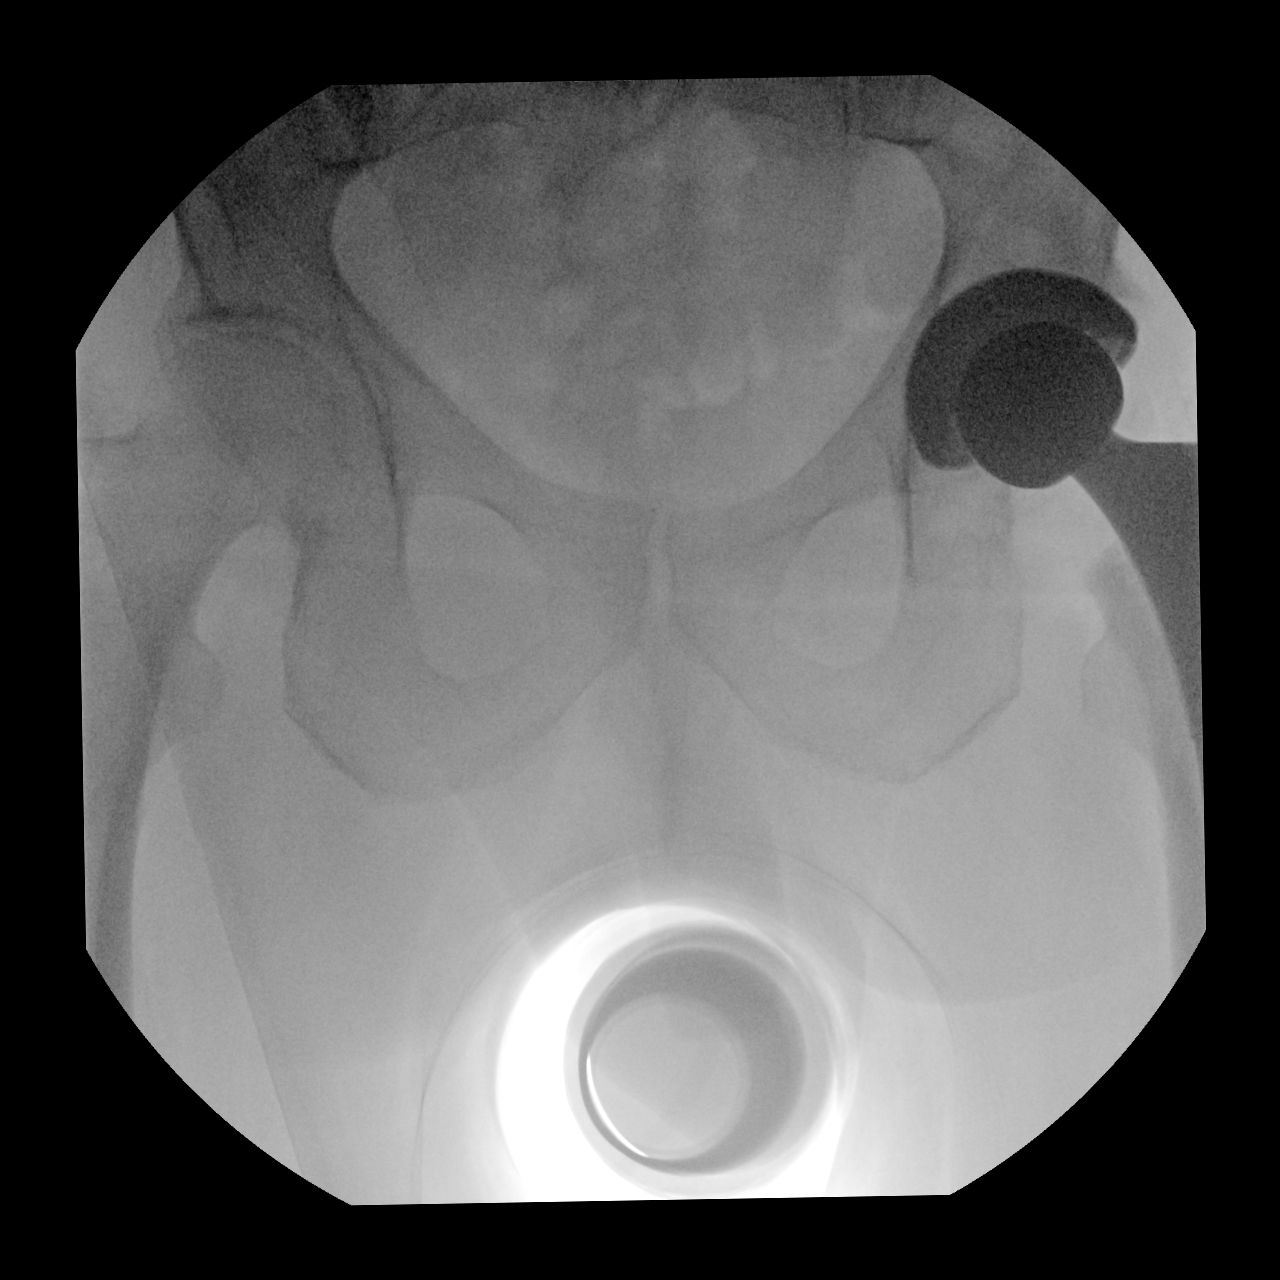

[2 of 2 positions shown; findings below may reference images not displayed]

FINDINGS: Two intraoperative views of the left hip demonstrate interval
placement of left total hip arthroplasty with prosthetic components
intact and normally located. Remainder the exam is unchanged.
Recommend correlation with findings at the time of the procedure.
IMPRESSION: Expected changes post left total hip arthroplasty.

## 2020-07-02 IMAGING — RF DG C-ARM 1-60 MIN-NO REPORT
1 series · 2 of 2 positions shown · non-contrast
Comparison: 11/24/2018

CLINICAL DATA: Left hip arthroplasty.

EXAM:
OPERATIVE left HIP (WITH PELVIS IF PERFORMED) to VIEWS
TECHNIQUE: Fluoroscopic spot image(s) were submitted for interpretation
post-operatively.

[Series 1: unknown protocol · 0.20mm/px · 2 of 2 slices shown]
[im 1/2]
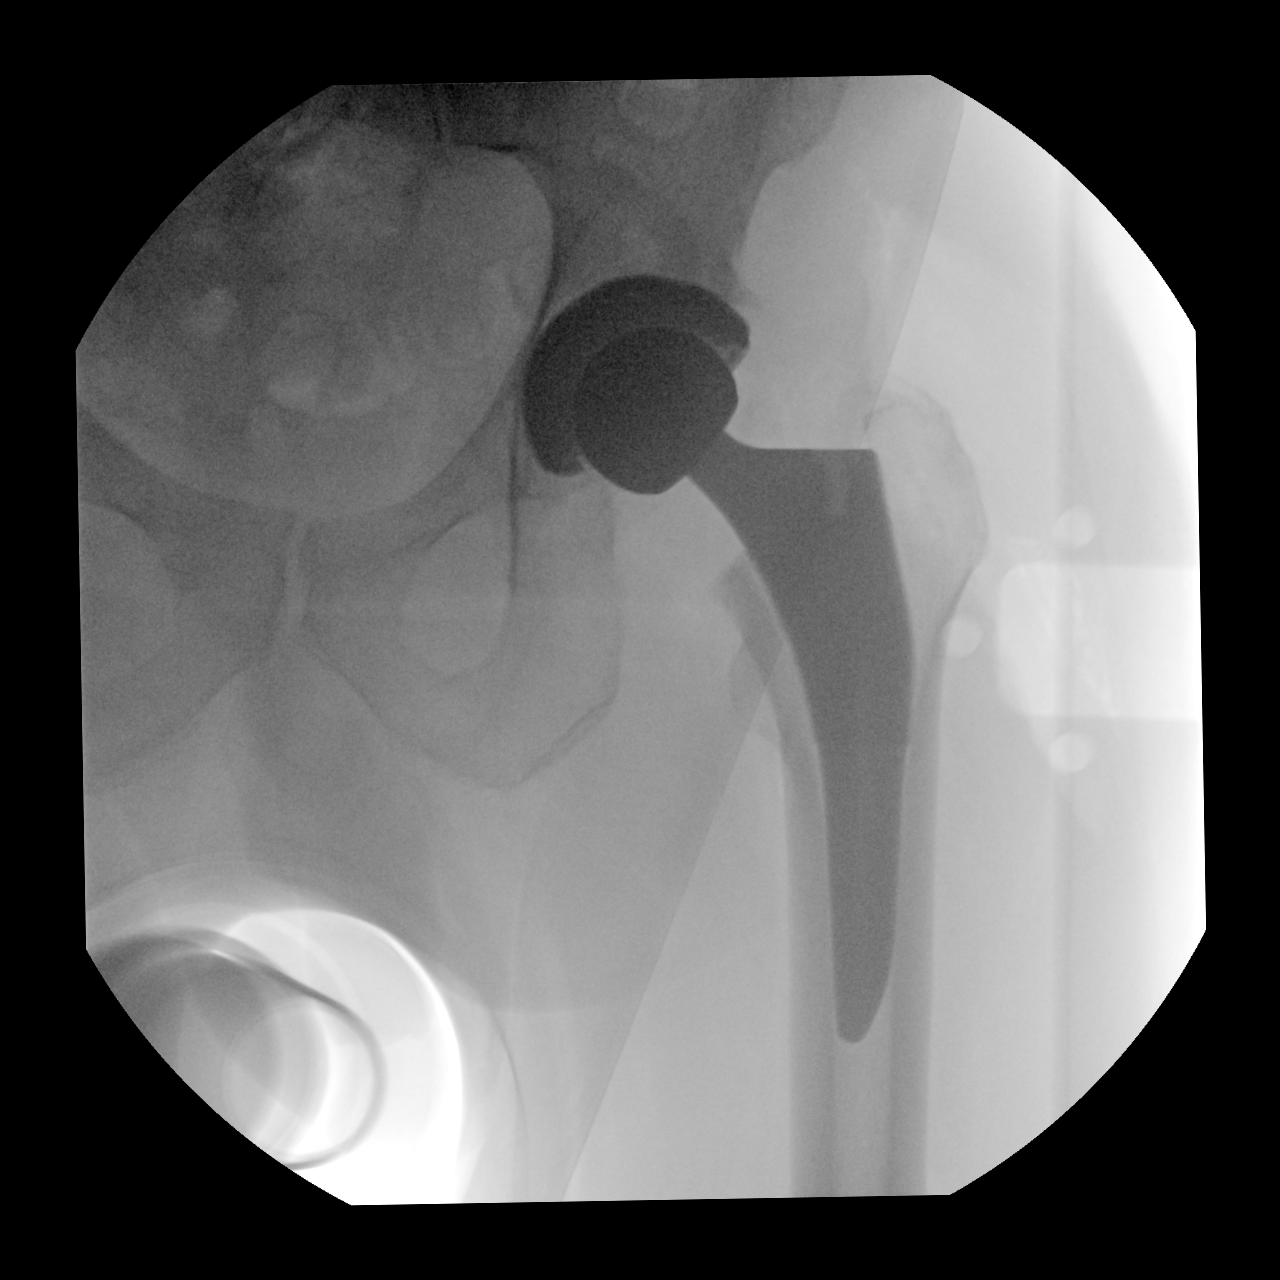
[im 2/2]
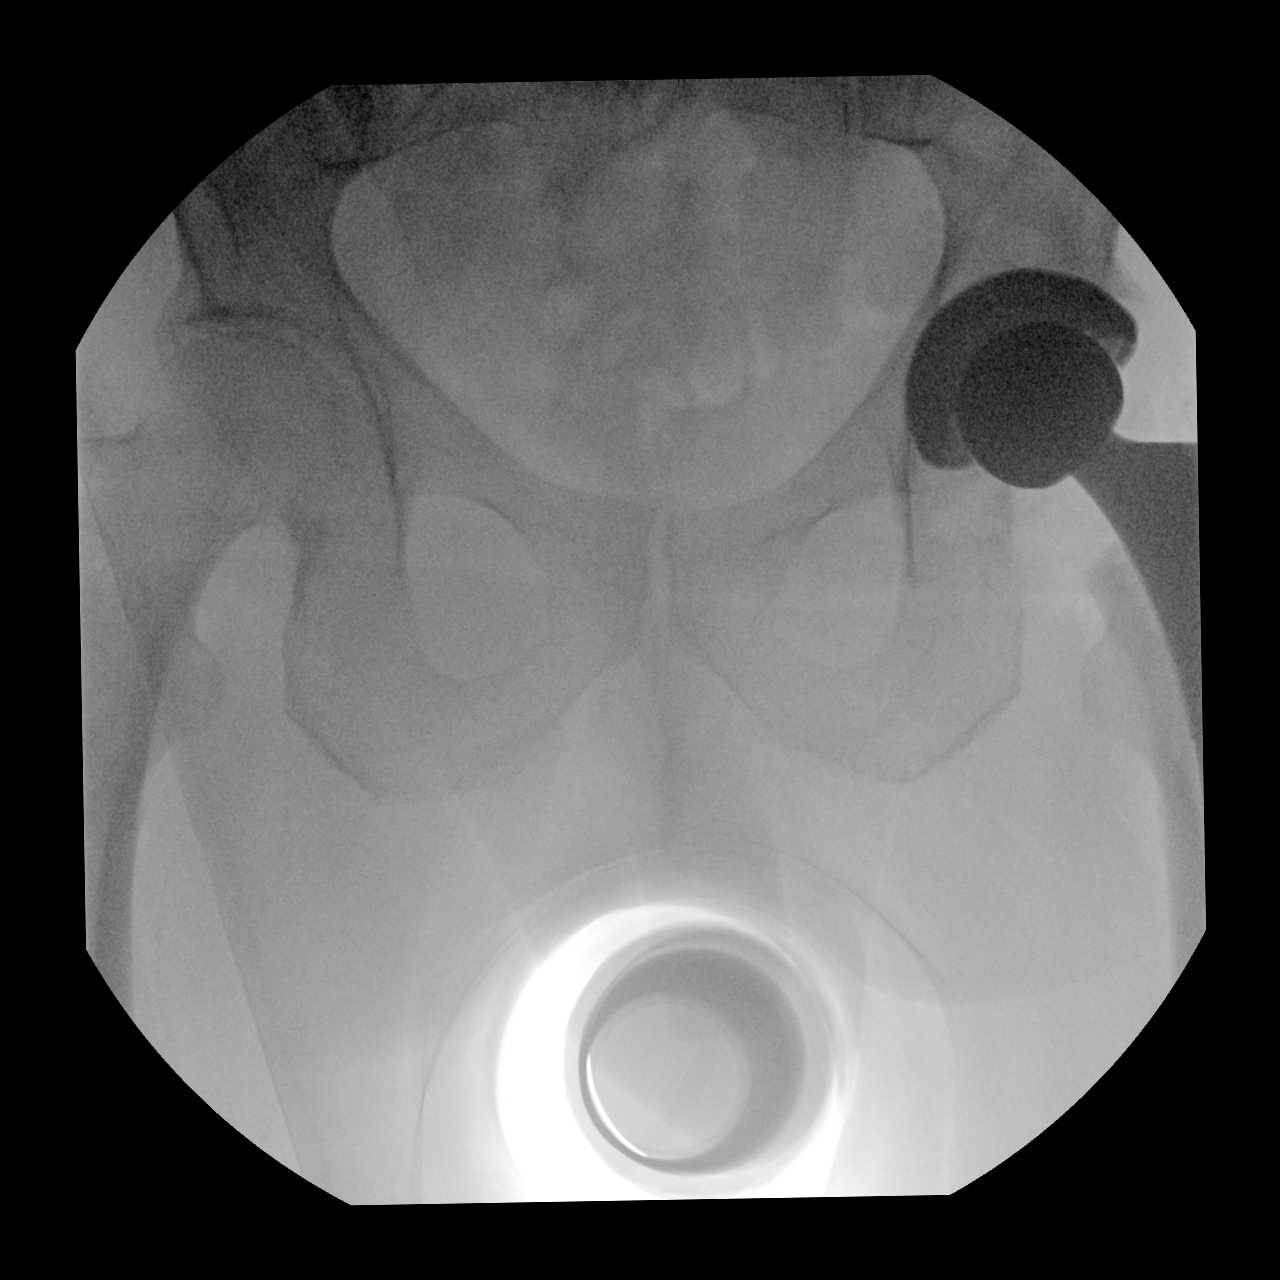

[2 of 2 positions shown; findings below may reference images not displayed]

FINDINGS: Two intraoperative views of the left hip demonstrate interval
placement of left total hip arthroplasty with prosthetic components
intact and normally located. Remainder the exam is unchanged.
Recommend correlation with findings at the time of the procedure.
IMPRESSION: Expected changes post left total hip arthroplasty.

## 2020-07-22 DIAGNOSIS — H40033 Anatomical narrow angle, bilateral: Secondary | ICD-10-CM | POA: Diagnosis not present

## 2020-07-22 DIAGNOSIS — H04123 Dry eye syndrome of bilateral lacrimal glands: Secondary | ICD-10-CM | POA: Diagnosis not present

## 2020-08-26 ENCOUNTER — Telehealth: Payer: Self-pay | Admitting: *Deleted

## 2020-08-26 ENCOUNTER — Telehealth: Payer: Medicare Other | Admitting: *Deleted

## 2020-08-26 NOTE — Telephone Encounter (Signed)
  Chronic Care Management   Outreach Note  08/26/2020 Name: Gary Lopez MRN: [id number] DOB: Feb 12, 1951  Referred by: Janora Norlander, DO Reason for referral : Chronic Care Management (RN follow up)   A 2nd unsuccessful telephone follow-up was attempted today. The patient was referred to the case management team for assistance with care management and care coordination.   Follow Up Plan: A HIPAA compliant phone message was left for the patient providing contact information and requesting a return call.  The care management team will reach out to the patient again over the next 10 days.   Chong Sicilian, BSN, RN-BC Embedded Chronic Care Manager [facility] [place] [facility] Direct Dial: [phone]

## 2020-08-31 NOTE — Telephone Encounter (Signed)
Pt has been r/s  

## 2020-09-26 ENCOUNTER — Ambulatory Visit (INDEPENDENT_AMBULATORY_CARE_PROVIDER_SITE_OTHER): Payer: Medicare Other | Admitting: Family Medicine

## 2020-09-26 ENCOUNTER — Other Ambulatory Visit: Payer: Self-pay

## 2020-09-26 ENCOUNTER — Telehealth: Payer: Medicare Other

## 2020-09-26 ENCOUNTER — Encounter: Payer: Self-pay | Admitting: Family Medicine

## 2020-09-26 VITALS — BP 138/80 | HR 86 | Temp 98.0°F | Ht 68.0 in | Wt 208.0 lb

## 2020-09-26 DIAGNOSIS — Z125 Encounter for screening for malignant neoplasm of prostate: Secondary | ICD-10-CM | POA: Diagnosis not present

## 2020-09-26 DIAGNOSIS — E1159 Type 2 diabetes mellitus with other circulatory complications: Secondary | ICD-10-CM

## 2020-09-26 DIAGNOSIS — I152 Hypertension secondary to endocrine disorders: Secondary | ICD-10-CM

## 2020-09-26 DIAGNOSIS — E785 Hyperlipidemia, unspecified: Secondary | ICD-10-CM

## 2020-09-26 DIAGNOSIS — E1169 Type 2 diabetes mellitus with other specified complication: Secondary | ICD-10-CM

## 2020-09-26 LAB — BAYER DCA HB A1C WAIVED: HB A1C (BAYER DCA - WAIVED): 6 % (ref <7.0)

## 2020-09-26 MED ORDER — METFORMIN HCL 500 MG PO TABS
1000.0000 mg | ORAL_TABLET | Freq: Every day | ORAL | 0 refills | Status: DC
Start: 2020-09-26 — End: 2020-09-26

## 2020-09-26 NOTE — Progress Notes (Signed)
Subjective: CC: f/u DM2, HTN PCP: Janora Norlander, DO Gary Lopez is a 69 y.o. male presenting to clinic today for:  1. Type 2 Diabetes w/ HTN, HLD:  Patient reports compliance with losartan 50 mg daily, Norvasc 10 mg daily and atorvastatin 40 mg daily.  He has been taking 1 tablet of Metformin daily and sometimes twice daily.  Blood sugars have been controlled except for when he went on vacation.  No changes in sensation, ulcerations, chest pain or shortness of breath.  Last eye exam: [facility] done, ROI completed Last foot exam: needs Last A1c:  Lab Results  Component Value Date   HGBA1C 6.2 05/24/2020   Nephropathy screen indicated?: on ARB Last flu, zoster and/or pneumovax: needs PNA 23 and influenza but declines Immunization History  Administered Date(s) Administered  . Fluad Quad(high Dose 65+) 10/24/2019  . Moderna SARS-COVID-2 Vaccination 03/17/2020, 04/14/2020  . Pneumococcal Conjugate-13 11/24/2018  . Tdap 11/24/2018    ROS: No chest pain, shortness of breath, visual disturbance.  He is getting around easier after his hip surgery.   ROS: Per HPI  No Known Allergies Past Medical History:  Diagnosis Date  . Abnormal EKG    left bundle branch blcok ekg 06-24-2019 saw dr Domenic Polite cardiology for  . Arthritis    oa  . Heart murmur   . History of pneumonia    20 years ago  . Hypertension   . Type 2 diabetes mellitus ([place])    Diet controlled    Current Outpatient Medications:  .  amLODipine (NORVASC) 10 MG tablet, Take 1 tablet (10 mg total) by mouth daily., Disp: 90 tablet, Rfl: 3 .  atorvastatin (LIPITOR) 40 MG tablet, Take 1 tablet (40 mg total) by mouth daily., Disp: 90 tablet, Rfl: 3 .  losartan (COZAAR) 50 MG tablet, Take 1 tablet (50 mg total) by mouth daily., Disp: 90 tablet, Rfl: 3 .  metFORMIN (GLUCOPHAGE) 500 MG tablet, Take 2 tablets (1,000 mg total) by mouth 2 (two) times daily with a meal for 28 days., Disp: 360 tablet, Rfl: 0 Social  History   Socioeconomic History  . Marital status: Married    Spouse name: Not on file  . Number of children: Not on file  . Years of education: Not on file  . Highest education level: Not on file  Occupational History  . Occupation: Truck Geophysicist/field seismologist  Tobacco Use  . Smoking status: Never Smoker  . Smokeless tobacco: Never Used  Vaping Use  . Vaping Use: Never used  Substance and Sexual Activity  . Alcohol use: Not Currently  . Drug use: Not Currently  . Sexual activity: Not Currently  Other Topics Concern  . Not on file  Social History Narrative  . Not on file   Social Determinants of Health   Financial Resource Strain: Low Risk   . Difficulty of Paying Living Expenses: Not hard at all  Food Insecurity: No Food Insecurity  . Worried About Charity fundraiser in the Last Year: Never true  . Ran Out of Food in the Last Year: Never true  Transportation Needs: No Transportation Needs  . Lack of Transportation (Medical): No  . Lack of Transportation (Non-Medical): No  Physical Activity: Sufficiently Active  . Days of Exercise per Week: 7 days  . Minutes of Exercise per Session: 50 min  Stress: No Stress Concern Present  . Feeling of Stress : Not at all  Social Connections: Moderately Isolated  . Frequency of Communication with  Friends and Family: More than three times a week  . Frequency of Social Gatherings with Friends and Family: More than three times a week  . Attends Religious Services: Never  . Active Member of Clubs or Organizations: No  . Attends Archivist Meetings: Never  . Marital Status: Married  Human resources officer Violence: Not At Risk  . Fear of Current or Ex-Partner: No  . Emotionally Abused: No  . Physically Abused: No  . Sexually Abused: No   Family History  Problem Relation Age of Onset  . Heart disease Mother   . COPD Father     Objective: Office vital signs reviewed. BP 138/80   Pulse 86   Temp 98 F (36.7 C) (Temporal)   Ht 5' 8"   (1.727 m)   Wt 208 lb (94.3 kg)   SpO2 97%   BMI 31.63 kg/m   Physical Examination:  General: Awake, alert, well appearing, No acute distress Cardio: regular rate and rhythm, S1S2 heard, soft systolic murmur appreciated at RSB Pulm: clear to auscultation bilaterally, no wheezes, rhonchi or rales; normal work of breathing on room air Extremities: warm, well perfused, No edema, cyanosis or clubbing; +2 pulses bilaterally Neuro: see DM foot  Diabetic Foot Exam - Simple   Simple Foot Form Diabetic Foot exam was performed with the following findings: Yes 09/26/2020  8:37 AM  Visual Inspection No deformities, no ulcerations, no other skin breakdown bilaterally: Yes Sensation Testing Intact to touch and monofilament testing bilaterally: Yes Pulse Check Posterior Tibialis and Dorsalis pulse intact bilaterally: Yes Comments Onychomycotic changes noted throughout the toenails.  Vibratory sensation intact    Assessment/ Plan: 69 y.o. male   1. Controlled type 2 diabetes mellitus with other specified complication, without long-term current use of insulin (HCC) A1c 5.9 today.  I am going to stop his Metformin and we will follow-up in 3 months.  Diabetic foot exam performed to release of information for eye exam performed - Bayer DCA Hb A1c Waived  2. Hypertension associated with diabetes ([place]) Controlled.  Continue current regimen  3. Hyperlipidemia due to type 2 diabetes mellitus (HCC) Check lipid panel.  Continue statin - Lipid Panel - CMP14+EGFR  4. Screening for malignant neoplasm of prostate Check PSA - PSA  Orders Placed This Encounter  Procedures  . Bayer DCA Hb A1c Waived  . Lipid Panel  . CMP14+EGFR  . PSA   No orders of the defined types were placed in this encounter.  Medications Discontinued During This Encounter  Medication Reason  . metFORMIN (GLUCOPHAGE) 500 MG tablet   . metFORMIN (GLUCOPHAGE) 500 MG tablet       Janora Norlander, DO [place] [phone]

## 2020-09-26 NOTE — Patient Instructions (Signed)
A1c is down to 5.9.  Go down to 2 tablets of Metformin with breakfast only.  If A1c stays below 6 through the holidays, we can stop the Metformin totally next visit.

## 2020-09-27 LAB — LIPID PANEL
Chol/HDL Ratio: 3.4 ratio (ref 0.0–5.0)
Cholesterol, Total: 94 mg/dL — ABNORMAL LOW (ref 100–199)
HDL: 28 mg/dL — ABNORMAL LOW (ref >39)
LDL Chol Calc (NIH): 52 mg/dL (ref 0–99)
Triglycerides: 62 mg/dL (ref 0–149)
VLDL Cholesterol Cal: 14 mg/dL (ref 5–40)

## 2020-09-27 LAB — CMP14+EGFR
ALT: 48 IU/L — ABNORMAL HIGH (ref 0–44)
AST: 33 IU/L (ref 0–40)
Albumin/Globulin Ratio: 1.7 (ref 1.2–2.2)
Albumin: 4.7 g/dL (ref 3.8–4.8)
Alkaline Phosphatase: 84 IU/L (ref 44–121)
BUN/Creatinine Ratio: 13 (ref 10–24)
BUN: 13 mg/dL (ref 8–27)
Bilirubin Total: 0.5 mg/dL (ref 0.0–1.2)
CO2: 25 mmol/L (ref 20–29)
Calcium: 9.3 mg/dL (ref 8.6–10.2)
Chloride: 102 mmol/L (ref 96–106)
Creatinine, Ser: 1 mg/dL (ref 0.76–1.27)
GFR calc Af Amer: 88 mL/min/{1.73_m2} (ref >59)
GFR calc non Af Amer: 76 mL/min/{1.73_m2} (ref >59)
Globulin, Total: 2.7 g/dL (ref 1.5–4.5)
Glucose: 117 mg/dL — ABNORMAL HIGH (ref 65–99)
Potassium: 4.5 mmol/L (ref 3.5–5.2)
Sodium: 140 mmol/L (ref 134–144)
Total Protein: 7.4 g/dL (ref 6.0–8.5)

## 2020-09-27 LAB — PSA: Prostate Specific Ag, Serum: 0.4 ng/mL (ref 0.0–4.0)

## 2020-11-22 ENCOUNTER — Telehealth: Payer: Medicare Other | Admitting: *Deleted

## 2020-11-28 ENCOUNTER — Encounter: Payer: Self-pay | Admitting: *Deleted

## 2020-12-27 ENCOUNTER — Ambulatory Visit: Payer: Medicare Other | Admitting: Family Medicine

## 2021-02-17 ENCOUNTER — Other Ambulatory Visit: Payer: Self-pay

## 2021-02-17 ENCOUNTER — Ambulatory Visit (INDEPENDENT_AMBULATORY_CARE_PROVIDER_SITE_OTHER): Payer: Medicare Other | Admitting: Family Medicine

## 2021-02-17 ENCOUNTER — Encounter: Payer: Self-pay | Admitting: Family Medicine

## 2021-02-17 VITALS — BP 146/83 | HR 80 | Temp 97.5°F | Ht 68.0 in | Wt 215.0 lb

## 2021-02-17 DIAGNOSIS — E1169 Type 2 diabetes mellitus with other specified complication: Secondary | ICD-10-CM

## 2021-02-17 DIAGNOSIS — E785 Hyperlipidemia, unspecified: Secondary | ICD-10-CM

## 2021-02-17 DIAGNOSIS — Z23 Encounter for immunization: Secondary | ICD-10-CM

## 2021-02-17 DIAGNOSIS — Z6379 Other stressful life events affecting family and household: Secondary | ICD-10-CM | POA: Diagnosis not present

## 2021-02-17 DIAGNOSIS — E1159 Type 2 diabetes mellitus with other circulatory complications: Secondary | ICD-10-CM | POA: Diagnosis not present

## 2021-02-17 DIAGNOSIS — I152 Hypertension secondary to endocrine disorders: Secondary | ICD-10-CM

## 2021-02-17 LAB — BAYER DCA HB A1C WAIVED: HB A1C (BAYER DCA - WAIVED): 6.9 % (ref <7.0)

## 2021-02-17 MED ORDER — AMLODIPINE BESYLATE 10 MG PO TABS: 10.0000 mg | ORAL_TABLET | Freq: Every day | ORAL | 3 refills | Status: DC

## 2021-02-17 MED ORDER — LOSARTAN POTASSIUM 50 MG PO TABS
50.0000 mg | ORAL_TABLET | Freq: Every day | ORAL | 3 refills | Status: DC
Start: 2021-02-17 — End: 2022-01-11

## 2021-02-17 MED ORDER — ATORVASTATIN CALCIUM 40 MG PO TABS: 40.0000 mg | ORAL_TABLET | Freq: Every day | ORAL | 3 refills | Status: DC

## 2021-02-17 NOTE — Progress Notes (Signed)
Subjective: CC: DM PCP: Janora Norlander, DO Gary Lopez is a 70 y.o. male presenting to clinic today for:  1. Type 2 Diabetes with hypertension, hyperlipidemia:  Checks blood sugar every once in a while and sugars are always less than 200.  He admits that he has not been abiding by a very strict diet lately as his wife was recently diagnosed with metastatic colon cancer and he is essentially just eating whatever would make her happy.  Does not report chest pain, shortness of breath, dizziness.  He tries stay physically active and takes walks in the woods sometimes.   Last eye exam: Had this done at [facility] Last foot exam: Up-to-date Last A1c:  Lab Results  Component Value Date   HGBA1C 6.0 09/26/2020   Nephropathy screen indicated?:  On ARB Last flu, zoster and/or pneumovax: Pneumonia shot due Immunization History  Administered Date(s) Administered  . Fluad Quad(high Dose 65+) 10/24/2019  . Moderna Sars-Covid-2 Vaccination 03/17/2020, 04/14/2020  . Pneumococcal Conjugate-13 11/24/2018  . Tdap 11/24/2018      ROS: Per HPI  No Known Allergies Past Medical History:  Diagnosis Date  . Abnormal EKG    left bundle branch blcok ekg 06-24-2019 saw dr Domenic Polite cardiology for  . Arthritis    oa  . Heart murmur   . History of pneumonia    20 years ago  . Hypertension   . Type 2 diabetes mellitus ([place])    Diet controlled    Current Outpatient Medications:  .  amLODipine (NORVASC) 10 MG tablet, Take 1 tablet (10 mg total) by mouth daily., Disp: 90 tablet, Rfl: 3 .  atorvastatin (LIPITOR) 40 MG tablet, Take 1 tablet (40 mg total) by mouth daily., Disp: 90 tablet, Rfl: 3 .  losartan (COZAAR) 50 MG tablet, Take 1 tablet (50 mg total) by mouth daily., Disp: 90 tablet, Rfl: 3 Social History   Socioeconomic History  . Marital status: Married    Spouse name: Not on file  . Number of children: Not on file  . Years of education: Not on file  . Highest education level:  Not on file  Occupational History  . Occupation: Truck Geophysicist/field seismologist  Tobacco Use  . Smoking status: Never Smoker  . Smokeless tobacco: Never Used  Vaping Use  . Vaping Use: Never used  Substance and Sexual Activity  . Alcohol use: Not Currently  . Drug use: Not Currently  . Sexual activity: Not Currently  Other Topics Concern  . Not on file  Social History Narrative  . Not on file   Social Determinants of Health   Financial Resource Strain: Low Risk   . Difficulty of Paying Living Expenses: Not hard at all  Food Insecurity: No Food Insecurity  . Worried About Charity fundraiser in the Last Year: Never true  . Ran Out of Food in the Last Year: Never true  Transportation Needs: No Transportation Needs  . Lack of Transportation (Medical): No  . Lack of Transportation (Non-Medical): No  Physical Activity: Sufficiently Active  . Days of Exercise per Week: 7 days  . Minutes of Exercise per Session: 50 min  Stress: No Stress Concern Present  . Feeling of Stress : Not at all  Social Connections: Moderately Isolated  . Frequency of Communication with Friends and Family: More than three times a week  . Frequency of Social Gatherings with Friends and Family: More than three times a week  . Attends Religious Services: Never  . Active  Member of Clubs or Organizations: No  . Attends Archivist Meetings: Never  . Marital Status: Married  Human resources officer Violence: Not At Risk  . Fear of Current or Ex-Partner: No  . Emotionally Abused: No  . Physically Abused: No  . Sexually Abused: No   Family History  Problem Relation Age of Onset  . Heart disease Mother   . COPD Father     Objective: Office vital signs reviewed. BP (!) 146/83   Pulse 80   Temp (!) 97.5 F (36.4 C) (Temporal)   Ht 5\' 8"  (1.727 m)   Wt 215 lb (97.5 kg)   SpO2 96%   BMI 32.69 kg/m   Physical Examination:  General: Awake, alert, well nourished, No acute distress HEENT: Normal,sclera white Cardio:  regular rate and rhythm, S1S2 heard, 2/6 SEM appreciated Pulm: clear to auscultation bilaterally, no wheezes, rhonchi or rales; normal work of breathing on room air Extremities: warm, well perfused, No edema, cyanosis or clubbing; +2 pulses bilaterally MSK: normal gait and station Mood: Somewhat depressed.  Patient is pleasant and interactive.  Thought process is linear  Assessment/ Plan: 70 y.o. male   Controlled type 2 diabetes mellitus with other specified complication, without long-term current use of insulin ([place]) - Plan: Bayer DCA Hb A1c Waived  Hypertension associated with diabetes ([place]) - Plan: amLODipine (NORVASC) 10 MG tablet  Hyperlipidemia due to type 2 diabetes mellitus ([place]) - Plan: atorvastatin (LIPITOR) 40 MG tablet  Stress due to illness of family member  Check A1c  Blood pressure controlled for age  Continue statin  Offered counseling services and/or medication but patient declined this for now.  He will contact me if things change.  No orders of the defined types were placed in this encounter.  No orders of the defined types were placed in this encounter.    Janora Norlander, DO [place] [phone]

## 2021-02-17 NOTE — Addendum Note (Signed)
Addended byCarrolyn Leigh on: 02/17/2021 12:45 PM   Modules accepted: Orders

## 2021-06-09 NOTE — Progress Notes (Signed)
Subjective: CC:DM PCP: Gary Ip, DO VNR:RJDYZ R Wieneke is a 70 y.o. male presenting to clinic today for:  1. Type 2 Diabetes with hypertension, hyperlipidemia: Patient admits to stressors surrounding his wife's metastatic colon cancer diagnosis.  She is currently undergoing chemotherapy.  He does take frequent walks in efforts to relieve stress but has not really followed a strict diet.  No reports of change in vision, polydipsia, polyuria or chest pain.  Not monitoring blood sugars at home because he does not have a monitor.  Last eye exam: needs Last foot exam: UTD Last A1c:  Lab Results  Component Value Date   HGBA1C 6.9 02/17/2021   Nephropathy screen indicated?: UTD Last flu, zoster and/or pneumovax:  Immunization History  Administered Date(s) Administered   Fluad Quad(high Dose 65+) 10/24/2019   Moderna Sars-Covid-2 Vaccination 03/17/2020, 04/14/2020   Pneumococcal Conjugate-13 11/24/2018   Pneumococcal Polysaccharide-23 02/17/2021   Tdap 11/24/2018     ROS: Per HPI  No Known Allergies Past Medical History:  Diagnosis Date   Abnormal EKG    left bundle branch blcok ekg 06-24-2019 saw dr Diona Browner cardiology for   Arthritis    oa   Heart murmur    History of pneumonia    20 years ago   Hypertension    Type 2 diabetes mellitus (HCC)    Diet controlled    Current Outpatient Medications:    amLODipine (NORVASC) 10 MG tablet, Take 1 tablet (10 mg total) by mouth daily., Disp: 90 tablet, Rfl: 3   atorvastatin (LIPITOR) 40 MG tablet, Take 1 tablet (40 mg total) by mouth daily., Disp: 90 tablet, Rfl: 3   losartan (COZAAR) 50 MG tablet, Take 1 tablet (50 mg total) by mouth daily., Disp: 90 tablet, Rfl: 3 Social History   Socioeconomic History   Marital status: Married    Spouse name: Not on file   Number of children: Not on file   Years of education: Not on file   Highest education level: Not on file  Occupational History   Occupation: Truck Hospital doctor   Tobacco Use   Smoking status: Never   Smokeless tobacco: Never  Vaping Use   Vaping Use: Never used  Substance and Sexual Activity   Alcohol use: Not Currently   Drug use: Not Currently   Sexual activity: Not Currently  Other Topics Concern   Not on file  Social History Narrative   Not on file   Social Determinants of Health   Financial Resource Strain: Not on file  Food Insecurity: Not on file  Transportation Needs: Not on file  Physical Activity: Not on file  Stress: Not on file  Social Connections: Not on file  Intimate Partner Violence: Not on file   Family History  Problem Relation Age of Onset   Heart disease Mother    COPD Father     Objective: Office vital signs reviewed. BP (!) 146/90   Pulse 88   Temp 97.8 F (36.6 C)   Ht 5\' 8"  (1.727 m)   Wt 213 lb 3.2 oz (96.7 kg)   SpO2 96%   BMI 32.42 kg/m   Physical Examination:  General: Awake, alert, well nourished, No acute distress HEENT: Normal, sclera white Cardio: regular rate and rhythm, S1S2 heard, no murmurs appreciated Pulm: clear to auscultation bilaterally, no wheezes, rhonchi or rales; normal work of breathing on room air Skin: dry; intact; no rashes or lesions Psych: mood depressed. Thought process linear Declined PHQ/ GAD7 screens  Assessment/  Plan: 70 y.o. male   Uncontrolled type 2 diabetes mellitus with hyperglycemia ([place]) - Plan: Bayer DCA Hb A1c Waived, CMP14+EGFR, metFORMIN (GLUCOPHAGE XR) 500 MG 24 hr tablet, glucose blood test strip, Lancets MISC, blood glucose meter kit and supplies, DISCONTINUED: metFORMIN (GLUCOPHAGE XR) 500 MG 24 hr tablet  Hypertension associated with diabetes ([place]) - Plan: CMP14+EGFR  Hyperlipidemia due to type 2 diabetes mellitus ([place]) - Plan: Lipid Panel, CMP14+EGFR  Stress due to illness of family member  Sugar now uncontrolled A1c rising to 9.4.  Have added metformin back to his regimen.  He will start with 1 daily and increase to 2 tablets daily in 2  weeks so as to reduce risk of GI side effects.  Like to see him back in 3 months for recheck.  Glucose supplies have been sent as well.  Unfortunately did not have a free monitor in office today  Check CMP, lipid panel.  Blood pressure is controlled for age.  Continues to struggle with his wife's illness I do think this is impacting his lifestyle choices.  I offered assistance for his reactive mood issues but he declined this today.  No orders of the defined types were placed in this encounter.  No orders of the defined types were placed in this encounter.    Janora Norlander, DO [place] [phone]

## 2021-06-16 ENCOUNTER — Ambulatory Visit (INDEPENDENT_AMBULATORY_CARE_PROVIDER_SITE_OTHER): Payer: Medicare Other

## 2021-06-16 VITALS — Ht 68.0 in | Wt 213.0 lb

## 2021-06-16 DIAGNOSIS — Z0000 Encounter for general adult medical examination without abnormal findings: Secondary | ICD-10-CM

## 2021-06-16 NOTE — Progress Notes (Signed)
Subjective:   Gary Lopez is a 70 y.o. male who presents for an Initial Medicare Annual Wellness Visit.  Virtual Visit via Telephone Note  I connected with  Gary Lopez on 06/16/21 at  8:15 AM EDT by telephone and verified that I am speaking with the correct person using two identifiers.  Location: Patient: Home Provider: [facility] Persons participating in the virtual visit: patient/Nurse Health Advisor   I discussed the limitations, risks, security and privacy concerns of performing an evaluation and management service by telephone and the availability of in person appointments. The patient expressed understanding and agreed to proceed.  Interactive audio and video telecommunications were attempted between this nurse and patient, however failed, due to patient having technical difficulties OR patient did not have access to video capability.  We continued and completed visit with audio only.  Some vital signs may be absent or patient reported.   Koren Plyler E Shaunte Tuft, LPN   Review of Systems     Cardiac Risk Factors include: advanced age (>66men, >12 women);diabetes mellitus;dyslipidemia;hypertension;obesity (BMI >30kg/m2);male gender     Objective:    Today's Vitals   06/16/21 1004  Weight: 213 lb (96.6 kg)  Height: 5\' 8"  (1.727 m)   Body mass index is 32.39 kg/m.  Advanced Directives 06/16/2021 08/27/2019 08/26/2019 06/24/2019  Does Patient Have a Medical Advance Directive? No No No No  Would patient like information on creating a medical advance directive? No - Patient declined No - Patient declined No - Patient declined No - Patient declined    Current Medications (verified) Outpatient Encounter Medications as of 06/16/2021  Medication Sig   amLODipine (NORVASC) 10 MG tablet Take 1 tablet (10 mg total) by mouth daily.   atorvastatin (LIPITOR) 40 MG tablet Take 1 tablet (40 mg total) by mouth daily.   losartan (COZAAR) 50 MG tablet Take 1 tablet (50 mg total) by mouth daily.    No facility-administered encounter medications on file as of 06/16/2021.    Allergies (verified) Patient has no known allergies.   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   Procedure: TOTAL HIP ARTHROPLASTY ANTERIOR APPROACH;  Surgeon: Rod Can, MD;  Location: [facility];  Service: Orthopedics;  Laterality: Left;   Family History  Problem Relation Age of Onset   Heart disease Mother    COPD Father    Social History   Socioeconomic History   Marital status: Married    Spouse name: [name]   Number of children: 2   Years of education: Not on file   Highest education level: Not on file  Occupational History   Occupation: Truck Geophysicist/field seismologist  Tobacco Use   Smoking status: Never   Smokeless tobacco: Never  Vaping Use   Vaping Use: Never used  Substance and Sexual Activity   Alcohol use: Not Currently   Drug use: Not Currently   Sexual activity: Not Currently  Other Topics Concern   Not on file  Social History Narrative   Lives home with wife. They have 2 children -  one 2 hours away, one in [place].   Social Determinants of Health   Financial Resource Strain: Low Risk    Difficulty of Paying Living Expenses: Not hard at all  Food Insecurity: No Food Insecurity   Worried About Charity fundraiser in the Last Year: Never true   [place] in the Last Year: Never true  Transportation Needs: No Transportation Needs   Lack of Transportation (Medical): No   Lack of Transportation (Non-Medical): No  Physical Activity: Sufficiently Active   Days of Exercise per Week: 7 days   Minutes of Exercise per Session:  30 min  Stress: No Stress Concern Present   Feeling of Stress : Not at all  Social Connections: Moderately Isolated   Frequency of Communication with Friends and Family: More than three times a week   Frequency of Social Gatherings with Friends and Family: More than three times a week   Attends Religious Services: Never   Marine scientist or Organizations: No   Attends Music therapist: Never   Marital Status: Married    Tobacco Counseling Counseling given: Not Answered   Clinical Intake:  Pre-visit preparation completed: Yes  Pain : No/denies pain     BMI - recorded: 32.7 Nutritional Status: BMI > 30  Obese Nutritional Risks: None Diabetes: Yes CBG done?: No Did pt. bring in CBG monitor from home?: No  How often do you need to have someone help you when you read instructions, pamphlets, or other written materials from your doctor or pharmacy?: 1 - Never  Nutrition Risk Assessment:  Has the patient had any N/V/D within the last 2 months?  No  Does the patient have any non-healing wounds?  No  Has the patient had any unintentional weight loss or weight gain?  No   Diabetes:  Is the patient diabetic?  Yes  If diabetic, was a CBG obtained today?  No  Did the patient bring in their glucometer from home?  No  How often do you monitor your CBG's? Once a week.   Financial Strains and Diabetes Management:  Are you having any financial strains with the device, your supplies or your medication? No .  Does the patient want to be seen by Chronic Care Management for management of their diabetes?  No  Would the patient like to be referred to a Nutritionist or for Diabetic Management?  No   Diabetic Exams:  Diabetic Eye Exam: Completed 07/22/2020.   Diabetic Foot Exam: Completed 10/18/221. Pt has been advised about the importance in completing this exam. Pt is scheduled for diabetic foot exam on 06/19/21.    Interpreter Needed?: No  Information entered by  :: Rielynn Trulson, LPN   Activities of Daily Living In your present state of health, do you have any difficulty performing the following activities: 06/16/2021  Hearing? N  Vision? N  Difficulty concentrating or making decisions? N  Walking or climbing stairs? N  Dressing or bathing? N  Doing errands, shopping? N  Preparing Food and eating ? N  Using the Toilet? N  In the past six months, have you accidently leaked urine? N  Do you have problems with loss of bowel control? N  Managing your Medications? N  Managing your Finances? N  Housekeeping or managing your Housekeeping? N  Some recent data might be hidden    Patient Care Team: Janora Norlander, DO as PCP - General (Family Medicine) Rod Can, MD as Consulting Physician (Orthopedic  Surgery) Ilean China, RN as Registered Nurse  Indicate any recent Medical Services you may have received from other than [facility] providers in the past year (date may be approximate).     Assessment:   This is a routine wellness examination for Gary Lopez.  Hearing/Vision screen Hearing Screening - Comments:: Denies hearing difficulties  Vision Screening - Comments:: Wears eyeglasses - up to date with annual eye exam with Dr Marin Comment in [place] issues and exercise activities discussed: Current Exercise Habits: Home exercise routine, Type of exercise: walking, Time (Minutes): 30, Frequency (Times/Week): 7, Weekly Exercise (Minutes/Week): 210, Intensity: Mild, Exercise limited by: orthopedic condition(s)   Goals Addressed             This Visit's Progress    Exercise 3x per week (30 min per time)   On track      Depression Screen PHQ 2/9 Scores 06/16/2021 02/17/2021 09/26/2020 05/24/2020 02/22/2020 06/23/2019 06/09/2019  PHQ - 2 Score 0 1 0 0 0 0 0  PHQ- 9 Score - 2 0 0 - 0 -    Fall Risk Fall Risk  06/16/2021 02/17/2021 09/26/2020 05/24/2020 02/22/2020  Falls in the past year? 0 0 0 0 0  Number falls in past yr: 0 - - - -  Injury with  Fall? 0 - - - -  Risk for fall due to : Orthopedic patient;Impaired vision - - - -  Follow up Falls prevention discussed - - - -    FALL RISK PREVENTION PERTAINING TO THE HOME:  Any stairs in or around the home? Yes  If so, are there any without handrails? No  Home free of loose throw rugs in walkways, pet beds, electrical cords, etc? Yes  Adequate lighting in your home to reduce risk of falls? Yes   ASSISTIVE DEVICES UTILIZED TO PREVENT FALLS:  Life alert? No  Use of a cane, walker or w/c? No  Grab bars in the bathroom? Yes  Shower chair or bench in shower? No  Elevated toilet seat or a handicapped toilet? No   TIMED UP AND GO:  Was the test performed? No . Telephonic visit  Cognitive Function:     6CIT Screen 06/16/2021  What Year? 0 points  What month? 0 points  What time? 0 points  Count back from 20 0 points  Months in reverse 0 points  Repeat phrase 0 points  Total Score 0    Immunizations Immunization History  Administered Date(s) Administered   Fluad Quad(high Dose 65+) 10/24/2019   Moderna Sars-Covid-2 Vaccination 03/17/2020, 04/14/2020   Pneumococcal Conjugate-13 11/24/2018   Pneumococcal Polysaccharide-23 02/17/2021   Tdap 03/22/2006, 11/24/2018    TDAP status: Up to date  Flu Vaccine status: Declined, Education has been provided regarding the importance of this vaccine but patient still declined. Advised may receive this vaccine at local pharmacy or Health Dept. Aware to provide a copy of the vaccination record if obtained from local pharmacy or Health Dept. Verbalized acceptance and understanding.  Pneumococcal vaccine status: Up to date  Covid-19 vaccine status: Declined, Education has been provided regarding the importance of this vaccine but patient still declined. Advised may receive this vaccine at local pharmacy or Health Dept.or vaccine clinic. Aware to provide a copy of the vaccination record if obtained from local pharmacy or Health Dept.  Verbalized acceptance and understanding.  Qualifies for Shingles Vaccine? Yes   Zostavax completed No   Shingrix Completed?: No.    Education has been provided regarding the  importance of this vaccine. Patient has been advised to call insurance company to determine out of pocket expense if they have not yet received this vaccine. Advised may also receive vaccine at local pharmacy or Health Dept. Verbalized acceptance and understanding.  Screening Tests Health Maintenance  Topic Date Due   OPHTHALMOLOGY EXAM  Never done   COLONOSCOPY (Pts 45-46yrs Insurance coverage will need to be confirmed)  Never done   Zoster Vaccines- Shingrix (1 of 2) Never done   COVID-19 Vaccine (3 - Booster for Moderna series) 09/14/2020   INFLUENZA VACCINE  07/10/2021   HEMOGLOBIN A1C  08/20/2021   FOOT EXAM  09/26/2021   TETANUS/TDAP  11/24/2028   Hepatitis C Screening  Completed   PNA vac Low Risk Adult  Completed   HPV VACCINES  Aged Out    Health Maintenance  Health Maintenance Due  Topic Date Due   OPHTHALMOLOGY EXAM  Never done   COLONOSCOPY (Pts 45-9yrs Insurance coverage will need to be confirmed)  Never done   Zoster Vaccines- Shingrix (1 of 2) Never done   COVID-19 Vaccine (3 - Booster for Moderna series) 09/14/2020    Colorectal cancer screening: No longer required.  - patient declines  Lung Cancer Screening: (Low Dose CT Chest recommended if Age 51-80 years, 30 pack-year currently smoking OR have quit w/in 15years.) does not qualify.   Additional Screening:  Hepatitis C Screening: does qualify; Completed 02/22/2020  Vision Screening: Recommended annual ophthalmology exams for early detection of glaucoma and other disorders of the eye. Is the patient up to date with their annual eye exam?  Yes  Who is the provider or what is the name of the office in which the patient attends annual eye exams? Dr Marin Comment If pt is not established with a provider, would they like to be referred to a provider to  establish care? No .   Dental Screening: Recommended annual dental exams for proper oral hygiene  Community Resource Referral / Chronic Care Management: CRR required this visit?  No   CCM required this visit?  No      Plan:     I have personally reviewed and noted the following in the patient's chart:   Medical and social history Use of alcohol, tobacco or illicit drugs  Current medications and supplements including opioid prescriptions. Patient is not currently taking opioid prescriptions. Functional ability and status Nutritional status Physical activity Advanced directives List of other physicians Hospitalizations, surgeries, and ER visits in previous 12 months Vitals Screenings to include cognitive, depression, and falls Referrals and appointments  In addition, I have reviewed and discussed with patient certain preventive protocols, quality metrics, and best practice recommendations. A written personalized care plan for preventive services as well as general preventive health recommendations were provided to patient.     Sandrea Hammond, LPN   07/16/7543   Nurse Notes: None

## 2021-06-16 NOTE — Patient Instructions (Signed)
Gary Lopez , Thank you for taking time to come for your Medicare Wellness Visit. I appreciate your ongoing commitment to your health goals. Please review the following plan we discussed and let me know if I can assist you in the future.   Screening recommendations/referrals: Colonoscopy: Declined Recommended yearly ophthalmology/optometry visit for glaucoma screening and checkup Recommended yearly dental visit for hygiene and checkup  Vaccinations: Influenza vaccine: Declined (due every fall) Pneumococcal vaccine: Done 11/24/2018 & 02/17/2021 Tdap vaccine: Done 11/24/2018 - Repeat in 10 years  Shingles vaccine: Due. Shingrix discussed. Please contact your pharmacy for coverage information if you decide you want this. (2 doses 2-6 months apart - over 90% effective against the shingles virus)   Covid-19: Done 03/17/20 & 04/14/20 - due for booster  Advanced directives: Advance directive discussed with you today. Even though you declined this today, please call our office should you change your mind, and we can give you the proper paperwork for you to fill out.   Conditions/risks identified: Aim for 30 minutes of exercise or brisk walking each day, drink 6-8 glasses of water and eat lots of fruits and vegetables.   Next appointment: Follow up in one year for your annual wellness visit.   Preventive Care 71 Years and Older, Male  Preventive care refers to lifestyle choices and visits with your health care provider that can promote health and wellness. What does preventive care include? A yearly physical exam. This is also called an annual well check. Dental exams once or twice a year. Routine eye exams. Ask your health care provider how often you should have your eyes checked. Personal lifestyle choices, including: Daily care of your teeth and gums. Regular physical activity. Eating a healthy diet. Avoiding tobacco and drug use. Limiting alcohol use. Practicing safe sex. Taking low doses of  aspirin every day. Taking vitamin and mineral supplements as recommended by your health care provider. What happens during an annual well check? The services and screenings done by your health care provider during your annual well check will depend on your age, overall health, lifestyle risk factors, and family history of disease. Counseling  Your health care provider may ask you questions about your: Alcohol use. Tobacco use. Drug use. Emotional well-being. Home and relationship well-being. Sexual activity. Eating habits. History of falls. Memory and ability to understand (cognition). Work and work Statistician. Screening  You may have the following tests or measurements: Height, weight, and BMI. Blood pressure. Lipid and cholesterol levels. These may be checked every 5 years, or more frequently if you are over 53 years old. Skin check. Lung cancer screening. You may have this screening every year starting at age 47 if you have a 30-pack-year history of smoking and currently smoke or have quit within the past 15 years. Fecal occult blood test (FOBT) of the stool. You may have this test every year starting at age 30. Flexible sigmoidoscopy or colonoscopy. You may have a sigmoidoscopy every 5 years or a colonoscopy every 10 years starting at age 20. Prostate cancer screening. Recommendations will vary depending on your family history and other risks. Hepatitis C blood test. Hepatitis B blood test. Sexually transmitted disease (STD) testing. Diabetes screening. This is done by checking your blood sugar (glucose) after you have not eaten for a while (fasting). You may have this done every 1-3 years. Abdominal aortic aneurysm (AAA) screening. You may need this if you are a current or former smoker. Osteoporosis. You may be screened starting at age 42  if you are at high risk. Talk with your health care provider about your test results, treatment options, and if necessary, the need for more  tests. Vaccines  Your health care provider may recommend certain vaccines, such as: Influenza vaccine. This is recommended every year. Tetanus, diphtheria, and acellular pertussis (Tdap, Td) vaccine. You may need a Td booster every 10 years. Zoster vaccine. You may need this after age 75. Pneumococcal 13-valent conjugate (PCV13) vaccine. One dose is recommended after age 71. Pneumococcal polysaccharide (PPSV23) vaccine. One dose is recommended after age 27. Talk to your health care provider about which screenings and vaccines you need and how often you need them. This information is not intended to replace advice given to you by your health care provider. Make sure you discuss any questions you have with your health care provider. Document Released: 12/23/2015 Document Revised: 08/15/2016 Document Reviewed: 09/27/2015 Elsevier Interactive Patient Education  2017 [place] Prevention in the Home Falls can cause injuries. They can happen to people of all ages. There are many things you can do to make your home safe and to help prevent falls. What can I do on the outside of my home? Regularly fix the edges of walkways and driveways and fix any cracks. Remove anything that might make you trip as you walk through a door, such as a raised step or threshold. Trim any bushes or trees on the path to your home. Use bright outdoor lighting. Clear any walking paths of anything that might make someone trip, such as rocks or tools. Regularly check to see if handrails are loose or broken. Make sure that both sides of any steps have handrails. Any raised decks and porches should have guardrails on the edges. Have any leaves, snow, or ice cleared regularly. Use sand or salt on walking paths during winter. Clean up any spills in your garage right away. This includes oil or grease spills. What can I do in the bathroom? Use night lights. Install grab bars by the toilet and in the tub and shower.  Do not use towel bars as grab bars. Use non-skid mats or decals in the tub or shower. If you need to sit down in the shower, use a plastic, non-slip stool. Keep the floor dry. Clean up any water that spills on the floor as soon as it happens. Remove soap buildup in the tub or shower regularly. Attach bath mats securely with double-sided non-slip rug tape. Do not have throw rugs and other things on the floor that can make you trip. What can I do in the bedroom? Use night lights. Make sure that you have a light by your bed that is easy to reach. Do not use any sheets or blankets that are too big for your bed. They should not hang down onto the floor. Have a firm chair that has side arms. You can use this for support while you get dressed. Do not have throw rugs and other things on the floor that can make you trip. What can I do in the kitchen? Clean up any spills right away. Avoid walking on wet floors. Keep items that you use a lot in easy-to-reach places. If you need to reach something above you, use a strong step stool that has a grab bar. Keep electrical cords out of the way. Do not use floor polish or wax that makes floors slippery. If you must use wax, use non-skid floor wax. Do not have throw rugs and other things  on the floor that can make you trip. What can I do with my stairs? Do not leave any items on the stairs. Make sure that there are handrails on both sides of the stairs and use them. Fix handrails that are broken or loose. Make sure that handrails are as long as the stairways. Check any carpeting to make sure that it is firmly attached to the stairs. Fix any carpet that is loose or worn. Avoid having throw rugs at the top or bottom of the stairs. If you do have throw rugs, attach them to the floor with carpet tape. Make sure that you have a light switch at the top of the stairs and the bottom of the stairs. If you do not have them, ask someone to add them for you. What else  can I do to help prevent falls? Wear shoes that: Do not have high heels. Have rubber bottoms. Are comfortable and fit you well. Are closed at the toe. Do not wear sandals. If you use a stepladder: Make sure that it is fully opened. Do not climb a closed stepladder. Make sure that both sides of the stepladder are locked into place. Ask someone to hold it for you, if possible. Clearly mark and make sure that you can see: Any grab bars or handrails. First and last steps. Where the edge of each step is. Use tools that help you move around (mobility aids) if they are needed. These include: Canes. Walkers. Scooters. Crutches. Turn on the lights when you go into a dark area. Replace any light bulbs as soon as they burn out. Set up your furniture so you have a clear path. Avoid moving your furniture around. If any of your floors are uneven, fix them. If there are any pets around you, be aware of where they are. Review your medicines with your doctor. Some medicines can make you feel dizzy. This can increase your chance of falling. Ask your doctor what other things that you can do to help prevent falls. This information is not intended to replace advice given to you by your health care provider. Make sure you discuss any questions you have with your health care provider. Document Released: 09/22/2009 Document Revised: 05/03/2016 Document Reviewed: 12/31/2014 Elsevier Interactive Patient Education  2017 [employer].

## 2021-06-19 ENCOUNTER — Encounter: Payer: Self-pay | Admitting: Family Medicine

## 2021-06-19 ENCOUNTER — Ambulatory Visit (INDEPENDENT_AMBULATORY_CARE_PROVIDER_SITE_OTHER): Payer: Medicare Other | Admitting: Family Medicine

## 2021-06-19 ENCOUNTER — Other Ambulatory Visit: Payer: Self-pay

## 2021-06-19 VITALS — BP 146/90 | HR 88 | Temp 97.8°F | Ht 68.0 in | Wt 213.2 lb

## 2021-06-19 DIAGNOSIS — E1169 Type 2 diabetes mellitus with other specified complication: Secondary | ICD-10-CM

## 2021-06-19 DIAGNOSIS — Z6379 Other stressful life events affecting family and household: Secondary | ICD-10-CM

## 2021-06-19 DIAGNOSIS — E1159 Type 2 diabetes mellitus with other circulatory complications: Secondary | ICD-10-CM

## 2021-06-19 DIAGNOSIS — I152 Hypertension secondary to endocrine disorders: Secondary | ICD-10-CM | POA: Diagnosis not present

## 2021-06-19 DIAGNOSIS — E785 Hyperlipidemia, unspecified: Secondary | ICD-10-CM

## 2021-06-19 DIAGNOSIS — E1165 Type 2 diabetes mellitus with hyperglycemia: Secondary | ICD-10-CM

## 2021-06-19 LAB — CMP14+EGFR
ALT: 57 IU/L — ABNORMAL HIGH (ref 0–44)
AST: 35 IU/L (ref 0–40)
Albumin/Globulin Ratio: 1.8 (ref 1.2–2.2)
Albumin: 4.8 g/dL — ABNORMAL HIGH (ref 3.7–4.7)
Alkaline Phosphatase: 104 IU/L (ref 44–121)
BUN/Creatinine Ratio: 19 (ref 10–24)
BUN: 18 mg/dL (ref 8–27)
Bilirubin Total: 0.7 mg/dL (ref 0.0–1.2)
CO2: 23 mmol/L (ref 20–29)
Calcium: 9.5 mg/dL (ref 8.6–10.2)
Chloride: 100 mmol/L (ref 96–106)
Creatinine, Ser: 0.97 mg/dL (ref 0.76–1.27)
Globulin, Total: 2.7 g/dL (ref 1.5–4.5)
Glucose: 270 mg/dL — ABNORMAL HIGH (ref 65–99)
Potassium: 4.9 mmol/L (ref 3.5–5.2)
Sodium: 137 mmol/L (ref 134–144)
Total Protein: 7.5 g/dL (ref 6.0–8.5)
eGFR: 83 mL/min/{1.73_m2} (ref >59)

## 2021-06-19 LAB — BAYER DCA HB A1C WAIVED: HB A1C (BAYER DCA - WAIVED): 9.4 % — ABNORMAL HIGH (ref <7.0)

## 2021-06-19 LAB — LIPID PANEL
Chol/HDL Ratio: 4.3 ratio (ref 0.0–5.0)
Cholesterol, Total: 103 mg/dL (ref 100–199)
HDL: 24 mg/dL — ABNORMAL LOW (ref >39)
LDL Chol Calc (NIH): 61 mg/dL (ref 0–99)
Triglycerides: 89 mg/dL (ref 0–149)
VLDL Cholesterol Cal: 18 mg/dL (ref 5–40)

## 2021-06-19 MED ORDER — BLOOD GLUCOSE METER KIT: PACK | 0 refills | Status: AC

## 2021-06-19 MED ORDER — GLUCOSE BLOOD VI STRP: ORAL_STRIP | 12 refills | Status: DC

## 2021-06-19 MED ORDER — LANCETS MISC: Status: DC

## 2021-06-19 MED ORDER — METFORMIN HCL ER 500 MG PO TB24: ORAL_TABLET | ORAL | 3 refills | Status: DC

## 2021-06-19 NOTE — Patient Instructions (Signed)
Sugar has gone up a LOT since our last visit.  Your last A1c was 6.9.  Today it is 9.4.  you are near the level we start insulin at.  However, I would like you to work on diet and I'm putting you back on metformin.

## 2021-07-21 DIAGNOSIS — E119 Type 2 diabetes mellitus without complications: Secondary | ICD-10-CM | POA: Diagnosis not present

## 2021-07-21 DIAGNOSIS — H40033 Anatomical narrow angle, bilateral: Secondary | ICD-10-CM | POA: Diagnosis not present

## 2021-09-19 ENCOUNTER — Ambulatory Visit: Payer: Medicare Other | Admitting: Family Medicine

## 2021-10-11 ENCOUNTER — Other Ambulatory Visit: Payer: Self-pay

## 2021-10-11 ENCOUNTER — Ambulatory Visit (INDEPENDENT_AMBULATORY_CARE_PROVIDER_SITE_OTHER): Payer: Medicare Other | Admitting: Family Medicine

## 2021-10-11 ENCOUNTER — Encounter: Payer: Self-pay | Admitting: Family Medicine

## 2021-10-11 VITALS — BP 143/88 | HR 87 | Temp 97.8°F | Ht 68.0 in | Wt 209.8 lb

## 2021-10-11 DIAGNOSIS — E785 Hyperlipidemia, unspecified: Secondary | ICD-10-CM

## 2021-10-11 DIAGNOSIS — R748 Abnormal levels of other serum enzymes: Secondary | ICD-10-CM | POA: Diagnosis not present

## 2021-10-11 DIAGNOSIS — E1169 Type 2 diabetes mellitus with other specified complication: Secondary | ICD-10-CM

## 2021-10-11 DIAGNOSIS — I152 Hypertension secondary to endocrine disorders: Secondary | ICD-10-CM

## 2021-10-11 DIAGNOSIS — E11628 Type 2 diabetes mellitus with other skin complications: Secondary | ICD-10-CM | POA: Diagnosis not present

## 2021-10-11 DIAGNOSIS — Z23 Encounter for immunization: Secondary | ICD-10-CM | POA: Diagnosis not present

## 2021-10-11 DIAGNOSIS — E1165 Type 2 diabetes mellitus with hyperglycemia: Secondary | ICD-10-CM | POA: Diagnosis not present

## 2021-10-11 DIAGNOSIS — E1159 Type 2 diabetes mellitus with other circulatory complications: Secondary | ICD-10-CM | POA: Diagnosis not present

## 2021-10-11 LAB — CMP14+EGFR
ALT: 41 IU/L (ref 0–44)
AST: 30 IU/L (ref 0–40)
Albumin/Globulin Ratio: 1.8 (ref 1.2–2.2)
Albumin: 4.6 g/dL (ref 3.7–4.7)
Alkaline Phosphatase: 87 IU/L (ref 44–121)
BUN/Creatinine Ratio: 15 (ref 10–24)
BUN: 14 mg/dL (ref 8–27)
Bilirubin Total: 0.6 mg/dL (ref 0.0–1.2)
CO2: 25 mmol/L (ref 20–29)
Calcium: 9.6 mg/dL (ref 8.6–10.2)
Chloride: 100 mmol/L (ref 96–106)
Creatinine, Ser: 0.96 mg/dL (ref 0.76–1.27)
Globulin, Total: 2.5 g/dL (ref 1.5–4.5)
Glucose: 156 mg/dL — ABNORMAL HIGH (ref 70–99)
Potassium: 4.7 mmol/L (ref 3.5–5.2)
Sodium: 137 mmol/L (ref 134–144)
Total Protein: 7.1 g/dL (ref 6.0–8.5)
eGFR: 85 mL/min/{1.73_m2} (ref >59)

## 2021-10-11 LAB — BAYER DCA HB A1C WAIVED: HB A1C (BAYER DCA - WAIVED): 6.9 % — ABNORMAL HIGH (ref 4.8–5.6)

## 2021-10-11 NOTE — Progress Notes (Signed)
Subjective: CC: DM PCP: Gary Norlander, DO JHE:RDEYC R Cornforth is a 70 y.o. male presenting to clinic today for:  1. Type 2 Diabetes with hypertension, hyperlipidemia:  Reports compliance with his metformin 1000 mg daily extended release, Lipitor 40 mg daily, Norvasc 10 mg daily losartan 50 mg daily.  He just checked his pressure medications prior to arrival.  Blood sugars have been ranging anywhere between 115 and 160 with an average of 130s in the morning.  Last eye exam: Up-to-date Last foot exam: needs Last A1c:  Lab Results  Component Value Date   HGBA1C 9.4 (H) 06/19/2021   Nephropathy screen indicated?: on ARB Last flu, zoster and/or pneumovax:  Immunization History  Administered Date(s) Administered   Fluad Quad(high Dose 65+) 10/24/2019   Moderna Sars-Covid-2 Vaccination 03/17/2020, 04/14/2020   Pneumococcal Conjugate-13 11/24/2018   Pneumococcal Polysaccharide-23 02/17/2021   Tdap 03/22/2006, 11/24/2018    ROS: Denies dizziness, LOC, polyuria, polydipsia, unintended weight loss/gain, foot ulcerations, numbness or tingling in extremities, shortness of breath or chest pain.  ROS: Per HPI  No Known Allergies Past Medical History:  Diagnosis Date   Abnormal EKG    left bundle branch blcok ekg 06-24-2019 saw dr Domenic Polite cardiology for   Arthritis    oa   Heart murmur    History of pneumonia    20 years ago   Hypertension    Type 2 diabetes mellitus ([place])    Diet controlled    Current Outpatient Medications:    amLODipine (NORVASC) 10 MG tablet, Take 1 tablet (10 mg total) by mouth daily., Disp: 90 tablet, Rfl: 3   atorvastatin (LIPITOR) 40 MG tablet, Take 1 tablet (40 mg total) by mouth daily., Disp: 90 tablet, Rfl: 3   blood glucose meter kit and supplies, Dispense based on patient and insurance preference. Use up to 2 times daily as directed. (FOR ICD-10 E11.65, Disp: 1 each, Rfl: 0   glucose blood test strip, Test BGs up to 2 times daily. E11.65, Disp:  100 each, Rfl: 12   Lancets MISC, Test BGs up to 2 times daily E11.65, Disp: 100 each, Rfl: 03   losartan (COZAAR) 50 MG tablet, Take 1 tablet (50 mg total) by mouth daily., Disp: 90 tablet, Rfl: 3   metFORMIN (GLUCOPHAGE XR) 500 MG 24 hr tablet, Take 1 tablet (500 mg total) by mouth daily with breakfast for 14 days, THEN 2 tablets (1,000 mg total) daily with breakfast., Disp: 180 tablet, Rfl: 3 Social History   Socioeconomic History   Marital status: Married    Spouse name: [name]   Number of children: 2   Years of education: Not on file   Highest education level: Not on file  Occupational History   Occupation: Truck Geophysicist/field seismologist  Tobacco Use   Smoking status: Never   Smokeless tobacco: Never  Vaping Use   Vaping Use: Never used  Substance and Sexual Activity   Alcohol use: Not Currently   Drug use: Not Currently   Sexual activity: Not Currently  Other Topics Concern   Not on file  Social History Narrative   Lives home with wife. They have 2 children - one 2 hours away, one in [place].   Social Determinants of Health   Financial Resource Strain: Low Risk    Difficulty of Paying Living Expenses: Not hard at all  Food Insecurity: No Food Insecurity   Worried About Charity fundraiser in the Last Year: Never true   [employer]  in the Last Year: Never true  Transportation Needs: No Transportation Needs   Lack of Transportation (Medical): No   Lack of Transportation (Non-Medical): No  Physical Activity: Sufficiently Active   Days of Exercise per Week: 7 days   Minutes of Exercise per Session: 30 min  Stress: No Stress Concern Present   Feeling of Stress : Not at all  Social Connections: Moderately Isolated   Frequency of Communication with Friends and Family: More than three times a week   Frequency of Social Gatherings with Friends and Family: More than three times a week   Attends Religious Services: Never   Marine scientist or Organizations: No   Attends Programme researcher, broadcasting/film/video: Never   Marital Status: Married  Human resources officer Violence: Not At Risk   Fear of Current or Ex-Partner: No   Emotionally Abused: No   Physically Abused: No   Sexually Abused: No   Family History  Problem Relation Age of Onset   Heart disease Mother    COPD Father     Objective: Office vital signs reviewed. BP (!) 143/88   Pulse 87   Temp 97.8 F (36.6 C)   Ht 5' 8"  (1.727 m)   Wt 209 lb 12.8 oz (95.2 kg)   SpO2 96%   BMI 31.90 kg/m   Physical Examination:  General: Awake, alert, wel nourished, No acute distress HEENT: Normal, sclera white, MMM Cardio: regular rate and rhythm, S1S2 heard, no murmurs appreciated Pulm: clear to auscultation bilaterally, no wheezes, rhonchi or rales; normal work of breathing on room air Extremities: warm, well perfused, No edema, cyanosis or clubbing; +2 pulses bilaterally Skin: fungal dermatitis of bilateral plantar feet Neuro: See DM foot  Diabetic Foot Exam - Simple   Simple Foot Form Diabetic Foot exam was performed with the following findings: Yes 10/11/2021  8:31 AM  Visual Inspection See comments: Yes Sensation Testing Intact to touch and monofilament testing bilaterally: Yes Pulse Check Posterior Tibialis and Dorsalis pulse intact bilaterally: Yes Comments Onychomycotic changes to feet bilaterally including nails.      Assessment/ Plan: 70 y.o. male   Controlled type 2 diabetes mellitus with other skin complication, without long-term current use of insulin ([place]) - Plan: Bayer DCA Hb A1c Waived  Hypertension associated with diabetes ([place]) - Plan: CMP14+EGFR  Hyperlipidemia due to type 2 diabetes mellitus ([place]) - Plan: CMP14+EGFR  Elevated liver enzymes - Plan: CMP14+EGFR  Need for immunization against influenza - Plan: Flu Vaccine QUAD High Dose(Fluad)  Sugar now under control with A1c 6.9. NO changes.  Foot exam performed  BP controlled up recheck. Check CMP given elevation in LFTs last  visit  Not due for FLP.  Continue Statin  Flu shot given  Orders Placed This Encounter  Procedures   Bayer DCA Hb A1c Waived   CMP14+EGFR   No orders of the defined types were placed in this encounter.    Gary Norlander, DO [place] [phone]

## 2021-10-11 NOTE — Patient Instructions (Signed)
SUGAR at goal!

## 2021-11-13 ENCOUNTER — Ambulatory Visit: Payer: Medicare Other | Admitting: Family Medicine

## 2021-11-21 ENCOUNTER — Encounter: Payer: Self-pay | Admitting: Family Medicine

## 2021-11-21 ENCOUNTER — Ambulatory Visit (INDEPENDENT_AMBULATORY_CARE_PROVIDER_SITE_OTHER): Payer: Medicare Other | Admitting: Family Medicine

## 2021-11-21 VITALS — BP 152/90 | HR 101 | Temp 98.3°F | Ht 68.0 in | Wt 207.0 lb

## 2021-11-21 DIAGNOSIS — B9689 Other specified bacterial agents as the cause of diseases classified elsewhere: Secondary | ICD-10-CM

## 2021-11-21 DIAGNOSIS — I152 Hypertension secondary to endocrine disorders: Secondary | ICD-10-CM | POA: Diagnosis not present

## 2021-11-21 DIAGNOSIS — H109 Unspecified conjunctivitis: Secondary | ICD-10-CM | POA: Diagnosis not present

## 2021-11-21 DIAGNOSIS — E1159 Type 2 diabetes mellitus with other circulatory complications: Secondary | ICD-10-CM | POA: Diagnosis not present

## 2021-11-21 MED ORDER — POLYMYXIN B-TRIMETHOPRIM 10000-0.1 UNIT/ML-% OP SOLN: 2.0000 [drp] | OPHTHALMIC | 0 refills | Status: AC

## 2021-11-21 NOTE — Progress Notes (Signed)
Subjective:  Patient ID: Gary Lopez, male    DOB: Feb 02, 1951, 69 y.o.   MRN: [id number]  Patient Care Team: Janora Norlander, DO as PCP - General (Family Medicine) Rod Can, MD as Consulting Physician (Orthopedic Surgery) Ilean China, RN as Registered Nurse   Chief Complaint:  eye irritation (Itching and burning, x2 days)   HPI: Gary Lopez is a 70 y.o. male presenting on 11/21/2021 for eye irritation (Itching and burning, x2 days)   Pt presents today for evaluation of bilateral eye redness, green discharge, burning, and itching. States this started 3-4 days ago and seems to be worsening. Denies known injury. States matted shut in the mornings. No visual changes. Not wearing glasses today. No fever, chills, or other URI symptoms.   Eye Problem  Both eyes are affected. This is a new problem. The current episode started in the past 7 days. The problem occurs constantly. The problem has been gradually worsening. There was no injury mechanism. The pain is at a severity of 6/10. The pain is moderate. He Does not wear contacts. Associated symptoms include an eye discharge, eye redness and itching. Pertinent negatives include no blurred vision, double vision, fever, foreign body sensation, nausea, photophobia, recent URI, vomiting or weakness. He has tried eye drops for the symptoms. The treatment provided no relief.    Relevant past medical, surgical, family, and social history reviewed and updated as indicated.  Allergies and medications reviewed and updated. Data reviewed: Chart in Epic.   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   Procedure: TOTAL HIP ARTHROPLASTY ANTERIOR APPROACH;  Surgeon: Rod Can, MD;  Location: [facility];  Service: Orthopedics;  Laterality: Left;    Social History   Socioeconomic History   Marital status: Married    Spouse name: [name]   Number of children: 2   Years of education: Not on file   Highest education level: Not on file  Occupational History   Occupation: Truck Geophysicist/field seismologist  Tobacco Use   Smoking status: Never   Smokeless tobacco: Never  Vaping Use   Vaping Use: Never used  Substance and Sexual Activity   Alcohol use: Not Currently   Drug use: Not Currently   Sexual activity: Not Currently  Other Topics Concern   Not on file  Social History Narrative   Lives home with wife. They have 2 children - one 2 hours away, one in [place].   Social Determinants of Health   Financial Resource Strain: Low Risk    Difficulty of Paying Living Expenses: Not hard at all  Food Insecurity: No Food Insecurity   Worried About Charity fundraiser in the Last Year: Never true   [place] in the Last Year: Never true  Transportation Needs: No Transportation Needs   Lack of Transportation (Medical): No   Lack of Transportation (Non-Medical): No  Physical Activity: Sufficiently Active   Days of Exercise per Week: 7 days   Minutes of Exercise per Session: 30 min  Stress: No Stress Concern Present   Feeling of Stress : Not at all  Social Connections: Moderately Isolated   Frequency of Communication with Friends and Family: More than three times a week   Frequency of Social Gatherings with Friends and Family: More than three times a week   Attends Religious Services: Never   Marine scientist or Organizations: No   Attends Archivist Meetings: Never   Marital Status: Married  Human resources officer Violence: Not At Risk   Fear of Current or Ex-Partner: No   Emotionally Abused: No   Physically Abused: No   Sexually Abused: No    Outpatient  Encounter Medications as of 11/21/2021  Medication Sig   amLODipine (NORVASC) 10 MG tablet Take 1 tablet (10 mg total) by mouth daily.   atorvastatin (LIPITOR) 40 MG tablet Take 1 tablet (40 mg total) by mouth daily.   blood glucose meter kit and supplies Dispense based on patient and insurance preference. Use up to 2 times daily as directed. (FOR ICD-10 E11.65   glucose blood test strip Test BGs up to 2 times daily. E11.65   Lancets MISC Test BGs up to 2 times daily E11.65   losartan (COZAAR) 50 MG tablet Take 1 tablet (50 mg total) by mouth daily.   metFORMIN (GLUCOPHAGE XR) 500 MG 24 hr tablet Take 1 tablet (500 mg total) by mouth daily with breakfast for 14 days, THEN 2 tablets (1,000 mg total) daily with breakfast.   trimethoprim-polymyxin b (POLYTRIM) ophthalmic solution Place 2 drops into both eyes every 4 (four) hours for 7 days.   No facility-administered encounter medications on file as of 11/21/2021.    No Known Allergies  Review of Systems  Constitutional:  Negative for activity change, appetite change, chills, diaphoresis, fatigue, fever and unexpected weight change.  Eyes:  Positive for pain, discharge, redness and itching. Negative for blurred vision, double vision, photophobia and visual disturbance.  Respiratory:  Negative for cough and shortness of breath.   Cardiovascular:  Negative for chest pain, palpitations and leg swelling.  Gastrointestinal:  Negative for abdominal pain, nausea and vomiting.  Neurological:  Negative for dizziness, tremors, seizures, syncope, facial asymmetry, speech difficulty, weakness, light-headedness, numbness and headaches.  All other systems reviewed and are negative.      Objective:  BP (!) 152/90 (BP Location: Left Arm, Cuff Size: Normal)   Pulse (!) 101   Temp 98.3 F (36.8 C)   Ht 5' 8"  (1.727 m)   Wt 207 lb (93.9 kg)   SpO2 94%   BMI 31.47 kg/m    Wt Readings from Last 3 Encounters:  11/21/21 207 lb (93.9 kg)  10/11/21 209  lb 12.8 oz (95.2 kg)  06/19/21 213 lb 3.2 oz (96.7 kg)    Physical Exam Vitals and nursing note reviewed.  Constitutional:      General: He is not in acute distress.    Appearance: Normal appearance. He is obese. He is not ill-appearing, toxic-appearing or diaphoretic.  HENT:     Head: Normocephalic and atraumatic.     Right Ear: Tympanic membrane, ear canal and external ear normal.     Left Ear: Tympanic membrane, ear canal and external ear normal.     Nose: Nose normal.     Mouth/Throat:     Mouth: Mucous membranes are moist.     Pharynx: Oropharynx is clear.  Eyes:     General: Lids are normal. Lids are everted, no foreign bodies appreciated. Vision grossly  intact. Gaze aligned appropriately.        Right eye: Discharge present. No foreign body or hordeolum.        Left eye: Discharge present.No foreign body or hordeolum.     Extraocular Movements: Extraocular movements intact.     Conjunctiva/sclera:     Right eye: Right conjunctiva is injected. No hemorrhage.    Left eye: Left conjunctiva is injected. No hemorrhage.    Pupils: Pupils are equal, round, and reactive to light.  Cardiovascular:     Rate and Rhythm: Normal rate and regular rhythm.     Heart sounds: Normal heart sounds. No murmur heard.   No friction rub. No gallop.  Pulmonary:     Effort: Pulmonary effort is normal.     Breath sounds: Normal breath sounds.  Skin:    General: Skin is warm and dry.     Capillary Refill: Capillary refill takes less than 2 seconds.  Neurological:     General: No focal deficit present.     Mental Status: He is alert and oriented to person, place, and time.  Psychiatric:        Mood and Affect: Mood normal.        Thought Content: Thought content normal.        Judgment: Judgment normal.    Results for orders placed or performed in visit on 10/11/21  Bayer DCA Hb A1c Waived  Result Value Ref Range   HB A1C (BAYER DCA - WAIVED) 6.9 (H) 4.8 - 5.6 %  CMP14+EGFR  Result Value  Ref Range   Glucose 156 (H) 70 - 99 mg/dL   BUN 14 8 - 27 mg/dL   Creatinine, Ser 0.96 0.76 - 1.27 mg/dL   eGFR 85 >59 mL/min/1.73   BUN/Creatinine Ratio 15 10 - 24   Sodium 137 134 - 144 mmol/L   Potassium 4.7 3.5 - 5.2 mmol/L   Chloride 100 96 - 106 mmol/L   CO2 25 20 - 29 mmol/L   Calcium 9.6 8.6 - 10.2 mg/dL   Total Protein 7.1 6.0 - 8.5 g/dL   Albumin 4.6 3.7 - 4.7 g/dL   Globulin, Total 2.5 1.5 - 4.5 g/dL   Albumin/Globulin Ratio 1.8 1.2 - 2.2   Bilirubin Total 0.6 0.0 - 1.2 mg/dL   Alkaline Phosphatase 87 44 - 121 IU/L   AST 30 0 - 40 IU/L   ALT 41 0 - 44 IU/L       Pertinent labs & imaging results that were available during my care of the patient were reviewed by me and considered in my medical decision making.  Assessment & Plan:  Thorn was seen today for eye irritation.  Diagnoses and all orders for this visit:  Bacterial conjunctivitis of both eyes Vision intact. Symptomatic care and infection control discussed in detail. Report any new, worsening, or persistent symptoms. Polytrim as prescribed.  -     trimethoprim-polymyxin b (POLYTRIM) ophthalmic solution; Place 2 drops into both eyes every 4 (four) hours for 7 days.  Hypertension associated with diabetes ([place]) Pt has not taken blood pressure medications this morning. Pt aware to take once home. Report any persistent high readings.    Continue all other maintenance medications.  Follow up plan: Return if symptoms worsen or fail to improve.   Continue healthy lifestyle choices, including diet (rich in fruits, vegetables, and lean proteins, and low in salt and simple carbohydrates) and exercise (at least 30 minutes of moderate physical activity daily).  Educational  handout given for bacterial conjunctivitis  The above assessment and management plan was discussed with the patient. The patient verbalized understanding of and has agreed to the management plan. Patient is aware to call the clinic if they develop  any new symptoms or if symptoms persist or worsen. Patient is aware when to return to the clinic for a follow-up visit. Patient educated on when it is appropriate to go to the emergency department.   Monia Pouch, FNP-C [facility] [phone]

## 2022-01-11 ENCOUNTER — Encounter: Payer: Self-pay | Admitting: Family Medicine

## 2022-01-11 ENCOUNTER — Ambulatory Visit (INDEPENDENT_AMBULATORY_CARE_PROVIDER_SITE_OTHER): Payer: Medicare Other | Admitting: Family Medicine

## 2022-01-11 VITALS — BP 130/82 | HR 89 | Temp 97.6°F | Ht 68.0 in | Wt 208.4 lb

## 2022-01-11 DIAGNOSIS — E1169 Type 2 diabetes mellitus with other specified complication: Secondary | ICD-10-CM | POA: Diagnosis not present

## 2022-01-11 DIAGNOSIS — E1159 Type 2 diabetes mellitus with other circulatory complications: Secondary | ICD-10-CM

## 2022-01-11 DIAGNOSIS — E785 Hyperlipidemia, unspecified: Secondary | ICD-10-CM | POA: Diagnosis not present

## 2022-01-11 DIAGNOSIS — Z6379 Other stressful life events affecting family and household: Secondary | ICD-10-CM | POA: Diagnosis not present

## 2022-01-11 DIAGNOSIS — E11628 Type 2 diabetes mellitus with other skin complications: Secondary | ICD-10-CM

## 2022-01-11 DIAGNOSIS — I152 Hypertension secondary to endocrine disorders: Secondary | ICD-10-CM | POA: Diagnosis not present

## 2022-01-11 LAB — CMP14+EGFR
ALT: 56 IU/L — ABNORMAL HIGH (ref 0–44)
AST: 34 IU/L (ref 0–40)
Albumin/Globulin Ratio: 1.6 (ref 1.2–2.2)
Albumin: 4.9 g/dL — ABNORMAL HIGH (ref 3.7–4.7)
Alkaline Phosphatase: 92 IU/L (ref 44–121)
BUN/Creatinine Ratio: 20 (ref 10–24)
BUN: 18 mg/dL (ref 8–27)
Bilirubin Total: 0.5 mg/dL (ref 0.0–1.2)
CO2: 25 mmol/L (ref 20–29)
Calcium: 9.8 mg/dL (ref 8.6–10.2)
Chloride: 100 mmol/L (ref 96–106)
Creatinine, Ser: 0.9 mg/dL (ref 0.76–1.27)
Globulin, Total: 3 g/dL (ref 1.5–4.5)
Glucose: 145 mg/dL — ABNORMAL HIGH (ref 70–99)
Potassium: 5.1 mmol/L (ref 3.5–5.2)
Sodium: 139 mmol/L (ref 134–144)
Total Protein: 7.9 g/dL (ref 6.0–8.5)
eGFR: 91 mL/min/{1.73_m2} (ref >59)

## 2022-01-11 LAB — BAYER DCA HB A1C WAIVED: HB A1C (BAYER DCA - WAIVED): 7 % — ABNORMAL HIGH (ref 4.8–5.6)

## 2022-01-11 MED ORDER — AMLODIPINE BESYLATE 10 MG PO TABS: 10.0000 mg | ORAL_TABLET | Freq: Every day | ORAL | 3 refills | Status: DC

## 2022-01-11 MED ORDER — METFORMIN HCL ER 500 MG PO TB24: 500.0000 mg | ORAL_TABLET | Freq: Two times a day (BID) | ORAL | 3 refills | Status: DC

## 2022-01-11 MED ORDER — ATORVASTATIN CALCIUM 40 MG PO TABS: 40.0000 mg | ORAL_TABLET | Freq: Every day | ORAL | 3 refills | Status: DC

## 2022-01-11 MED ORDER — LOSARTAN POTASSIUM 50 MG PO TABS: 50.0000 mg | ORAL_TABLET | Freq: Every day | ORAL | 3 refills | Status: DC

## 2022-01-11 NOTE — Progress Notes (Addendum)
Subjective: CC:DM PCP: Janora Norlander, DO MHD:QQIWL Gary Lopez is a 71 y.o. male presenting to clinic today for:  1. Type 2 Diabetes with hypertension, hyperlipidemia:  Patient reports compliance with metformin extended release 500 mg twice daily, Norvasc 10 mg daily, Lipitor 40 mg daily and losartan 50 mg daily.  He is really been trying to cut back on his weight by exercising regularly.  He walks 3 to 4 miles per day and tries to maintain a healthy diet.  Last eye exam: needs Last foot exam: UTD Last A1c:  Lab Results  Component Value Date   HGBA1C 6.9 (H) 10/11/2021   Nephropathy screen indicated?: on ARB Last flu, zoster and/or pneumovax:  Immunization History  Administered Date(s) Administered   Fluad Quad(high Dose 65+) 10/24/2019, 10/11/2021   Moderna Sars-Covid-2 Vaccination 03/17/2020, 04/14/2020   Pneumococcal Conjugate-13 11/24/2018   Pneumococcal Polysaccharide-23 02/17/2021   Tdap 03/22/2006, 11/24/2018    ROS: Denies dizziness, LOC, polyuria, polydipsia, unintended weight loss/gain, foot ulcerations, numbness or tingling in extremities, shortness of breath or chest pain.  2.  Stress Patient does admit to some ongoing stress at home with his wife's diagnosis.  They will be celebrating their 49th wedding anniversary this June.  He does not feel like he needs any medical or therapeutic interventions at this time as he had his wife cope with her illness independently  ROS: Per HPI  No Known Allergies Past Medical History:  Diagnosis Date   Abnormal EKG    left bundle branch blcok ekg 06-24-2019 saw dr Domenic Polite cardiology for   Arthritis    oa   Heart murmur    History of pneumonia    20 years ago   Hypertension    Type 2 diabetes mellitus ([place])    Diet controlled    Current Outpatient Medications:    amLODipine (NORVASC) 10 MG tablet, Take 1 tablet (10 mg total) by mouth daily., Disp: 90 tablet, Rfl: 3   atorvastatin (LIPITOR) 40 MG tablet, Take 1  tablet (40 mg total) by mouth daily., Disp: 90 tablet, Rfl: 3   blood glucose meter kit and supplies, Dispense based on patient and insurance preference. Use up to 2 times daily as directed. (FOR ICD-10 E11.65, Disp: 1 each, Rfl: 0   glucose blood test strip, Test BGs up to 2 times daily. E11.65, Disp: 100 each, Rfl: 12   Lancets MISC, Test BGs up to 2 times daily E11.65, Disp: 100 each, Rfl: 03   losartan (COZAAR) 50 MG tablet, Take 1 tablet (50 mg total) by mouth daily., Disp: 90 tablet, Rfl: 3   metFORMIN (GLUCOPHAGE XR) 500 MG 24 hr tablet, Take 1 tablet (500 mg total) by mouth daily with breakfast for 14 days, THEN 2 tablets (1,000 mg total) daily with breakfast., Disp: 180 tablet, Rfl: 3 Social History   Socioeconomic History   Marital status: Married    Spouse name: [name]   Number of children: 2   Years of education: Not on file   Highest education level: Not on file  Occupational History   Occupation: Truck Geophysicist/field seismologist  Tobacco Use   Smoking status: Never   Smokeless tobacco: Never  Vaping Use   Vaping Use: Never used  Substance and Sexual Activity   Alcohol use: Not Currently   Drug use: Not Currently   Sexual activity: Not Currently  Other Topics Concern   Not on file  Social History Narrative   Lives home with wife. They have 2 children -  one 2 hours away, one in [place].   Social Determinants of Health   Financial Resource Strain: Low Risk    Difficulty of Paying Living Expenses: Not hard at all  Food Insecurity: No Food Insecurity   Worried About Charity fundraiser in the Last Year: Never true   [place] in the Last Year: Never true  Transportation Needs: No Transportation Needs   Lack of Transportation (Medical): No   Lack of Transportation (Non-Medical): No  Physical Activity: Sufficiently Active   Days of Exercise per Week: 7 days   Minutes of Exercise per Session: 30 min  Stress: No Stress Concern Present   Feeling of Stress : Not at all  Social  Connections: Moderately Isolated   Frequency of Communication with Friends and Family: More than three times a week   Frequency of Social Gatherings with Friends and Family: More than three times a week   Attends Religious Services: Never   Marine scientist or Organizations: No   Attends Music therapist: Never   Marital Status: Married  Human resources officer Violence: Not At Risk   Fear of Current or Ex-Partner: No   Emotionally Abused: No   Physically Abused: No   Sexually Abused: No   Family History  Problem Relation Age of Onset   Heart disease Mother    COPD Father     Objective: Office vital signs reviewed. BP 130/82    Pulse 89    Temp 97.6 F (36.4 C)    Ht _0  (1.727 m)    Wt 208 lb 6.4 oz (94.5 kg)    SpO2 96%    BMI 31.69 kg/m   Physical Examination:  General: Awake, alert, well nourished, No acute distress HEENT: Sclera white.  Moist mucous membranes Cardio: regular rate and rhythm, F2T2 heard, systolic murmur present Pulm: clear to auscultation bilaterally, no wheezes, rhonchi or rales; normal work of breathing on room air GI: Protuberant.  Soft, non-tender, non-distended, bowel sounds present x4, no hepatomegaly, no splenomegaly, no masses Extremities: warm, well perfused, No edema, cyanosis or clubbing; +2 pulses bilaterally   Assessment/ Plan: 71 y.o. 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  Check renal function, liver enzymes  Blood pressure  controlled.  Continue current regimen.  Renewal sent  Not yet due for fasting lipid.  Continue statin.  Offered counseling but he declines  No orders of the defined types were placed in this encounter.  No orders of the defined types were placed in this encounter.    Janora Norlander, DO [place] [phone]

## 2022-05-11 ENCOUNTER — Encounter: Payer: Self-pay | Admitting: Family Medicine

## 2022-05-11 ENCOUNTER — Ambulatory Visit (INDEPENDENT_AMBULATORY_CARE_PROVIDER_SITE_OTHER): Payer: Medicare Other | Admitting: Family Medicine

## 2022-05-11 VITALS — BP 141/85 | HR 74 | Temp 97.6°F | Ht 68.0 in | Wt 210.4 lb

## 2022-05-11 DIAGNOSIS — E1159 Type 2 diabetes mellitus with other circulatory complications: Secondary | ICD-10-CM | POA: Diagnosis not present

## 2022-05-11 DIAGNOSIS — E785 Hyperlipidemia, unspecified: Secondary | ICD-10-CM

## 2022-05-11 DIAGNOSIS — Z125 Encounter for screening for malignant neoplasm of prostate: Secondary | ICD-10-CM | POA: Diagnosis not present

## 2022-05-11 DIAGNOSIS — E11628 Type 2 diabetes mellitus with other skin complications: Secondary | ICD-10-CM | POA: Diagnosis not present

## 2022-05-11 DIAGNOSIS — E1169 Type 2 diabetes mellitus with other specified complication: Secondary | ICD-10-CM | POA: Diagnosis not present

## 2022-05-11 DIAGNOSIS — I152 Hypertension secondary to endocrine disorders: Secondary | ICD-10-CM | POA: Diagnosis not present

## 2022-05-11 LAB — BAYER DCA HB A1C WAIVED: HB A1C (BAYER DCA - WAIVED): 6.6 % — ABNORMAL HIGH (ref 4.8–5.6)

## 2022-05-11 NOTE — Progress Notes (Signed)
Subjective: CC:DM PCP: Janora Norlander, DO Gary Lopez is a 71 y.o. male presenting to clinic today for:  1. Type 2 Diabetes with hypertension, hyperlipidemia:  Patient reports blood sugars have been running around 150 each morning.  Has had no hypoglycemic episodes.  He is compliant with the metformin extended release 500 mg twice daily, losartan 50 mg daily, Norvasc 10 mg daily and Lipitor 40 mg daily.  He continues to walk 4 to 5 miles regularly.  He is tolerating exercise without difficulty  Last eye exam: Up-to-date Last foot exam: Up-to-date Last A1c:  Lab Results  Component Value Date   HGBA1C 7.0 (H) 01/11/2022   Nephropathy screen indicated?:  Up-to-date, on ARB Last flu, zoster and/or pneumovax:  Immunization History  Administered Date(s) Administered   Fluad Quad(high Dose 65+) 10/24/2019, 10/11/2021   Moderna Sars-Covid-2 Vaccination 03/17/2020, 04/14/2020   Pneumococcal Conjugate-13 11/24/2018   Pneumococcal Polysaccharide-23 02/17/2021   Tdap 03/22/2006, 11/24/2018    ROS: Denies dizziness, LOC, polyuria, polydipsia, unintended weight loss/gain, foot ulcerations, numbness or tingling in extremities, shortness of breath or chest pain.    ROS: Per HPI  No Known Allergies Past Medical History:  Diagnosis Date   Abnormal EKG    left bundle branch blcok ekg 06-24-2019 saw dr Domenic [facility] cardiology for   Arthritis    oa   Heart murmur    History of pneumonia    20 years ago   Hypertension    Type 2 diabetes mellitus ([place])    Diet controlled    Current Outpatient Medications:    amLODipine (NORVASC) 10 MG tablet, Take 1 tablet (10 mg total) by mouth daily., Disp: 90 tablet, Rfl: 3   atorvastatin (LIPITOR) 40 MG tablet, Take 1 tablet (40 mg total) by mouth daily., Disp: 90 tablet, Rfl: 3   blood glucose meter kit and supplies, Dispense based on patient and insurance preference. Use up to 2 times daily as directed. (FOR ICD-10 E11.65, Disp: 1 each,  Rfl: 0   glucose blood test strip, Test BGs up to 2 times daily. E11.65, Disp: 100 each, Rfl: 12   Lancets MISC, Test BGs up to 2 times daily E11.65, Disp: 100 each, Rfl: 03   losartan (COZAAR) 50 MG tablet, Take 1 tablet (50 mg total) by mouth daily., Disp: 90 tablet, Rfl: 3   metFORMIN (GLUCOPHAGE XR) 500 MG 24 hr tablet, Take 1 tablet (500 mg total) by mouth 2 (two) times daily with a meal., Disp: 180 tablet, Rfl: 3 Social History   Socioeconomic History   Marital status: Married    Spouse name: [name]   Number of children: 2   Years of education: Not on file   Highest education level: Not on file  Occupational History   Occupation: Truck Geophysicist/field seismologist  Tobacco Use   Smoking status: Never   Smokeless tobacco: Never  Vaping Use   Vaping Use: Never used  Substance and Sexual Activity   Alcohol use: Not Currently   Drug use: Not Currently   Sexual activity: Not Currently  Other Topics Concern   Not on file  Social History Narrative   Lives home with wife. They have 2 children - one 2 hours away, one in [place].   Social Determinants of Health   Financial Resource Strain: Low Risk    Difficulty of Paying Living Expenses: Not hard at all  Food Insecurity: No Food Insecurity   Worried About Charity fundraiser in the Last Year: Never true  Ran Out of Food in the Last Year: Never true  Transportation Needs: No Transportation Needs   Lack of Transportation (Medical): No   Lack of Transportation (Non-Medical): No  Physical Activity: Sufficiently Active   Days of Exercise per Week: 7 days   Minutes of Exercise per Session: 30 min  Stress: No Stress Concern Present   Feeling of Stress : Not at all  Social Connections: Moderately Isolated   Frequency of Communication with Friends and Family: More than three times a week   Frequency of Social Gatherings with Friends and Family: More than three times a week   Attends Religious Services: Never   Marine scientist or Organizations:  No   Attends Music therapist: Never   Marital Status: Married  Human resources officer Violence: Not At Risk   Fear of Current or Ex-Partner: No   Emotionally Abused: No   Physically Abused: No   Sexually Abused: No   Family History  Problem Relation Age of Onset   Heart disease Mother    COPD Father     Objective: Office vital signs reviewed. BP (!) 141/85   Pulse 74   Temp 97.6 F (36.4 C)   Ht '5\' 8"'  (1.727 m)   Wt 210 lb 6.4 oz (95.4 kg)   SpO2 95%   BMI 31.99 kg/m   Physical Examination:  General: Awake, alert, well nourished, No acute distress HEENT: Sclera white.  Moist mucous membranes.  No carotid bruits Cardio: regular rate and rhythm, S1S2 heard, no murmurs appreciated Pulm: clear to auscultation bilaterally, no wheezes, rhonchi or rales; normal work of breathing on room air  Assessment/ Plan: 71 y.o. 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  No changes.  Blood pressures well controlled for age.  No changes  Continue statin.  Check fasting lipid  Check PSA given collection of other labs  Orders Placed This Encounter  Procedures   Bayer DCA Hb A1c Waived   Lipid Panel   PSA   No orders of the defined types were placed in this encounter.    Janora Norlander, DO [place] [phone]

## 2022-05-12 LAB — LIPID PANEL
Chol/HDL Ratio: 3.1 ratio (ref 0.0–5.0)
Cholesterol, Total: 86 mg/dL — ABNORMAL LOW (ref 100–199)
HDL: 28 mg/dL — ABNORMAL LOW (ref >39)
LDL Chol Calc (NIH): 40 mg/dL (ref 0–99)
Triglycerides: 93 mg/dL (ref 0–149)
VLDL Cholesterol Cal: 18 mg/dL (ref 5–40)

## 2022-05-12 LAB — PSA: Prostate Specific Ag, Serum: 0.4 ng/mL (ref 0.0–4.0)

## 2022-06-19 ENCOUNTER — Ambulatory Visit (INDEPENDENT_AMBULATORY_CARE_PROVIDER_SITE_OTHER): Payer: Medicare Other

## 2022-06-19 VITALS — Wt 207.0 lb

## 2022-06-19 DIAGNOSIS — Z0000 Encounter for general adult medical examination without abnormal findings: Secondary | ICD-10-CM | POA: Diagnosis not present

## 2022-06-19 NOTE — Progress Notes (Signed)
Subjective:   BRECKEN DEWOODY is a 71 y.o. male who presents for Medicare Annual/Subsequent preventive examination.  Virtual Visit via Telephone Note  I connected with  KEYDEN PAVLOV on 06/19/22 at  9:00 AM EDT by telephone and verified that I am speaking with the correct person using two identifiers.  Location: Patient: Home Provider: [facility] Persons participating in the virtual visit: patient/Nurse Health Advisor   I discussed the limitations, risks, security and privacy concerns of performing an evaluation and management service by telephone and the availability of in person appointments. The patient expressed understanding and agreed to proceed.  Interactive audio and video telecommunications were attempted between this nurse and patient, however failed, due to patient having technical difficulties OR patient did not have access to video capability.  We continued and completed visit with audio only.  Some vital signs may be absent or patient reported.   Kennie Karapetian E Zayleigh Stroh, LPN   Review of Systems     Cardiac Risk Factors include: advanced age (>34mn, >>61women);diabetes mellitus;dyslipidemia;hypertension;male gender;obesity (BMI >30kg/m2)     Objective:    Today's Vitals   06/19/22 0903  Weight: 207 lb (93.9 kg)   Body mass index is 31.47 kg/m.     06/19/2022    9:09 AM 06/16/2021   10:19 AM 08/27/2019    6:00 PM 08/26/2019    8:14 AM 06/24/2019    2:26 PM  Advanced Directives  Does Patient Have a Medical Advance Directive? _0   Would patient like information on creating a medical advance directive? No - Patient declined No - Patient declined No - Patient declined No - Patient declined No - Patient declined    Current Medications (verified) Outpatient Encounter Medications as of 06/19/2022  Medication Sig   amLODipine (NORVASC) 10 MG tablet Take 1 tablet (10 mg total) by mouth daily.   atorvastatin (LIPITOR) 40 MG tablet Take 1 tablet (40 mg total) by mouth  daily.   blood glucose meter kit and supplies Dispense based on patient and insurance preference. Use up to 2 times daily as directed. (FOR ICD-10 E11.65   glucose blood test strip Test BGs up to 2 times daily. E11.65   Lancets MISC Test BGs up to 2 times daily E11.65   losartan (COZAAR) 50 MG tablet Take 1 tablet (50 mg total) by mouth daily.   metFORMIN (GLUCOPHAGE XR) 500 MG 24 hr tablet Take 1 tablet (500 mg total) by mouth 2 (two) times daily with a meal.   No facility-administered encounter medications on file as of 06/19/2022.    Allergies (verified) Patient has no known allergies.   History: Past Medical History:  Diagnosis Date   Abnormal EKG    left bundle branch blcok ekg 06-24-2019 saw dr mDomenic Politecardiology for   Arthritis    oa   Heart murmur    History of pneumonia    20 years ago   Hypertension    Type 2 diabetes mellitus ([place]    Diet controlled   Past Surgical History:  Procedure Laterality Date   right wrist surgery  yrs ago   TOTAL HIP ARTHROPLASTY 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   Procedure: TOTAL HIP ARTHROPLASTY ANTERIOR APPROACH;  Surgeon: SRod Can MD;  Location: [facility];  Service: Orthopedics;  Laterality: Left;   Family History  Problem Relation Age of Onset   Heart disease Mother    COPD Father    Social History   Socioeconomic History   Marital status: Married  Spouse name: [name]   Number of children: 2   Years of education: Not on file   Highest education level: Not on file  Occupational History   Occupation: Truck Geophysicist/field seismologist    Comment: retired  Tobacco Use   Smoking status: Never   Smokeless tobacco: Never  Vaping Use   Vaping Use: Never used  Substance and Sexual Activity   Alcohol use: Not Currently   Drug use: Not Currently   Sexual activity: Not Currently  Other Topics Concern   Not on file  Social History Narrative   Lives home with wife. They have 2 children - one 2 hours away, one in [place].   Social Determinants of Health    Financial Resource Strain: Low Risk  (06/19/2022)   Overall Financial Resource Strain (CARDIA)    Difficulty of Paying Living Expenses: Not hard at all  Food Insecurity: No Food Insecurity (06/19/2022)   Hunger Vital Sign    Worried About Running Out of Food in the Last Year: Never true    Ran Out of Food in the Last Year: Never true  Transportation Needs: No Transportation Needs (06/19/2022)   PRAPARE - Hydrologist (Medical): No    Lack of Transportation (Non-Medical): No  Physical Activity: Sufficiently Active (06/19/2022)   Exercise Vital Sign    Days of Exercise per Week: 6 days    Minutes of Exercise per Session: 90 min  Stress: No Stress Concern Present (06/19/2022)   [place]    Feeling of Stress : Not at all  Social Connections: Moderately Isolated (06/19/2022)   Social Connection and Isolation Panel [NHANES]    Frequency of Communication with Friends and Family: More than three times a week    Frequency of Social Gatherings with Friends and Family: More than three times a week    Attends Religious Services: Never    Marine scientist or Organizations: No    Attends Music therapist: Never    Marital Status: Married    Tobacco Counseling Counseling given: Not Answered   Clinical Intake:  Pre-visit preparation completed: Yes  Pain : No/denies pain     BMI - recorded: 31.93 Nutritional Status: BMI > 30  Obese Nutritional Risks: None Diabetes: Yes CBG done?: No Did pt. bring in CBG monitor from home?: No  How often do you need to have someone help you when you read instructions, pamphlets, or other written materials from your doctor or pharmacy?: 1 - Never  Diabetic? Nutrition Risk Assessment:  Has the patient had any N/V/D within the last 2 months?  No  Does the patient have any non-healing wounds?  No  Has the patient had any unintentional weight  loss or weight gain?  No   Diabetes:  Is the patient diabetic?  Yes  If diabetic, was a CBG obtained today?  No  Did the patient bring in their glucometer from home?  No  How often do you monitor your CBG's? Once or twice per day.   Financial Strains and Diabetes Management:  Are you having any financial strains with the device, your supplies or your medication? No .  Does the patient want to be seen by Chronic Care Management for management of their diabetes?  No  Would the patient like to be referred to a Nutritionist or for Diabetic Management?  No   Diabetic Exams:  Diabetic Eye Exam: Completed 07/19/2021.   Diabetic  Foot Exam: Completed 10/11/2021. Pt has been advised about the importance in completing this exam.   Interpreter Needed?: No  Information entered by :: Marlin Jarrard, LPN   Activities of Daily Living    06/19/2022    9:09 AM  In your present state of health, do you have any difficulty performing the following activities:  Hearing? 0  Vision? 0  Difficulty concentrating or making decisions? 0  Walking or climbing stairs? 0  Dressing or bathing? 0  Doing errands, shopping? 0  Preparing Food and eating ? N  Using the Toilet? N  In the past six months, have you accidently leaked urine? N  Do you have problems with loss of bowel control? N  Managing your Medications? N  Managing your Finances? N  Housekeeping or managing your Housekeeping? N    Patient Care Team: Janora Norlander, DO as PCP - General (Family Medicine) Rod Can, MD as Consulting Physician (Orthopedic Surgery) Ilean China, RN as Registered Nurse  Indicate any recent [place] you may have received from other than [facility] providers in the past year (date may be approximate).     Assessment:   This is a routine wellness examination for [place].  Hearing/Vision screen Hearing Screening - Comments:: Denies hearing difficulties   Vision Screening - Comments:: Wears rx glasses  prn only - up to date with routine eye exams with [facility]  Dietary issues and exercise activities discussed: Current Exercise Habits: Home exercise routine, Type of exercise: walking, Time (Minutes): 60, Frequency (Times/Week): 6, Weekly Exercise (Minutes/Week): 360, Intensity: Mild, Exercise limited by: None identified   Goals Addressed             This Visit's Progress    Exercise 3x per week (30 min per time)   On track    Continue walking daily 06/19/22 - usually walking 5 miles 5-6 days per week on [address]       Depression Screen    06/19/2022    9:07 AM 05/11/2022    8:08 AM 01/11/2022    9:30 AM 11/21/2021    8:31 AM 10/11/2021    8:04 AM 10/11/2021    8:03 AM 06/19/2021    8:19 AM  PHQ 2/9 Scores  PHQ - 2 Score 0 0 0 0 0 0 0  PHQ- 9 Score   0 0 0      Fall Risk    06/19/2022    9:05 AM 05/11/2022    8:08 AM 01/11/2022    9:30 AM 11/21/2021    8:32 AM 10/11/2021    8:03 AM  Fall Risk   Falls in the past year? 0 0 0 0 0  Number falls in past yr: 0      Injury with Fall? 0      Risk for fall due to : No Fall Risks      Follow up Falls prevention discussed        FALL RISK PREVENTION PERTAINING TO THE HOME:  Any stairs in or around the home? Yes  If so, are there any without handrails? No  Home free of loose throw rugs in walkways, pet beds, electrical cords, etc? Yes  Adequate lighting in your home to reduce risk of falls? Yes   ASSISTIVE DEVICES UTILIZED TO PREVENT FALLS:  Life alert? No  Use of a cane, walker or w/c? No  Grab bars in the bathroom? Yes  Shower chair or bench in shower? Yes  Elevated toilet seat or a handicapped toilet? Yes   TIMED UP AND GO:  Was the test performed? No . Telephonic visit  Cognitive Function:        06/19/2022    9:10 AM 06/16/2021   10:19 AM  6CIT Screen  What Year? 0 points 0 points  What month? 0 points 0 points  What time? 0 points 0 points  Count back from 20 0 points 0 points  Months  in reverse 4 points 0 points  Repeat phrase 10 points 0 points  Total Score 14 points 0 points    Immunizations Immunization History  Administered Date(s) Administered   Fluad Quad(high Dose 65+) 10/24/2019, 10/11/2021   Moderna Sars-Covid-2 Vaccination 03/17/2020, 04/14/2020   Pneumococcal Conjugate-13 11/24/2018   Pneumococcal Polysaccharide-23 02/17/2021   Tdap 03/22/2006, 11/24/2018    TDAP status: Up to date  Flu Vaccine status: Up to date  Pneumococcal vaccine status: Up to date  Covid-19 vaccine status: Completed vaccines  Qualifies for Shingles Vaccine? Yes   Zostavax completed No   Shingrix Completed?: No.    Education has been provided regarding the importance of this vaccine. Patient has been advised to call insurance company to determine out of pocket expense if they have not yet received this vaccine. Advised may also receive vaccine at local pharmacy or Health Dept. Verbalized acceptance and understanding.  Screening Tests Health Maintenance  Topic Date Due   COVID-19 Vaccine (3 - Moderna series) 06/09/2020   COLONOSCOPY (Pts 45-10yr Insurance coverage will need to be confirmed)  06/19/2022 (Originally 04/01/1995)   Zoster Vaccines- Shingrix (1 of 2) 08/11/2022 (Originally 03/31/2000)   INFLUENZA VACCINE  07/10/2022   OPHTHALMOLOGY EXAM  07/19/2022   FOOT EXAM  10/11/2022   HEMOGLOBIN A1C  11/10/2022   TETANUS/TDAP  11/24/2028   Pneumonia Vaccine 71 Years old  Completed   Hepatitis C Screening  Completed   HPV VACCINES  Aged Out    Health Maintenance  Health Maintenance Due  Topic Date Due   COVID-19 Vaccine (3 - Moderna series) 06/09/2020    Declined COLONOSCOPY  Lung Cancer Screening: (Low Dose CT Chest recommended if Age 72-80years, 30 pack-year currently smoking OR have quit w/in 15years.) does not qualify.   Additional Screening:  Hepatitis C Screening: does qualify; Completed 02/22/2020  Vision Screening: Recommended annual ophthalmology  exams for early detection of glaucoma and other disorders of the eye. Is the patient up to date with their annual eye exam?  Yes  Who is the provider or what is the name of the office in which the patient attends annual eye exams[place] not established with a provider, would they like to be referred to a provider to establish care? No .   Dental Screening: Recommended annual dental exams for proper oral hygiene  Community Resource Referral / Chronic Care Management: CRR required this visit?  No   CCM required this visit?  No      Plan:     I have personally reviewed and noted the following in the patient's chart:   Medical and social history Use of alcohol, tobacco or illicit drugs  Current medications and supplements including opioid prescriptions. Patient is not currently taking opioid prescriptions. Functional ability and status Nutritional status Physical activity Advanced directives List of other physicians Hospitalizations, surgeries, and ER visits in previous 12 months Vitals Screenings to include cognitive, depression, and falls Referrals and appointments  In addition, I have reviewed and discussed  with patient certain preventive protocols, quality metrics, and best practice recommendations. A written personalized care plan for preventive services as well as general preventive health recommendations were provided to patient.     Sandrea Hammond, LPN   12/21/1622   Nurse Notes: Patient was not happy to speak with me today - His 6CIT = 14, but he refused to answer some of the questions telling me it was not my job to insult his intelligence.

## 2022-06-19 NOTE — Patient Instructions (Signed)
Gary Lopez , Thank you for taking time to come for your Medicare Wellness Visit. I appreciate your ongoing commitment to your health goals. Please review the following plan we discussed and let me know if I can assist you in the future.   Screening recommendations/referrals: Colonoscopy: Declined Recommended yearly ophthalmology/optometry visit for glaucoma screening and checkup Recommended yearly dental visit for hygiene and checkup  Vaccinations: Influenza vaccine: Done 10/11/2021 - Repeat annually Pneumococcal vaccine: Done  11/24/2018 & 02/17/2021 Tdap vaccine: Done 11/24/2018 - Repeat in 10 years Shingles vaccine: Declined - Shingrix is 2 doses 2-6 months apart and over 90% effective     Covid-19: Done  03/17/2020 & 04/14/2020  Advanced directives: Advance directive discussed with you today. Even though you declined this today, please call our office should you change your mind, and we can give you the proper paperwork for you to fill out.  Conditions/risks identified: Keep up the great work! Aim for 30 minutes of exercise or brisk walking, 6-8 glasses of water, and 5 servings of vegetables each day.   Next appointment: Follow up in one year for your annual wellness visit.   Preventive Care 55 Years and Older, Male  Preventive care refers to lifestyle choices and visits with your health care provider that can promote health and wellness. What does preventive care include? A yearly physical exam. This is also called an annual well check. Dental exams once or twice a year. Routine eye exams. Ask your health care provider how often you should have your eyes checked. Personal lifestyle choices, including: Daily care of your teeth and gums. Regular physical activity. Eating a healthy diet. Avoiding tobacco and drug use. Limiting alcohol use. Practicing safe sex. Taking low doses of aspirin every day. Taking vitamin and mineral supplements as recommended by your health care  provider. What happens during an annual well check? The services and screenings done by your health care provider during your annual well check will depend on your age, overall health, lifestyle risk factors, and family history of disease. Counseling  Your health care provider may ask you questions about your: Alcohol use. Tobacco use. Drug use. Emotional well-being. Home and relationship well-being. Sexual activity. Eating habits. History of falls. Memory and ability to understand (cognition). Work and work Statistician. Screening  You may have the following tests or measurements: Height, weight, and BMI. Blood pressure. Lipid and cholesterol levels. These may be checked every 5 years, or more frequently if you are over 33 years old. Skin check. Lung cancer screening. You may have this screening every year starting at age 22 if you have a 30-pack-year history of smoking and currently smoke or have quit within the past 15 years. Fecal occult blood test (FOBT) of the stool. You may have this test every year starting at age 2. Flexible sigmoidoscopy or colonoscopy. You may have a sigmoidoscopy every 5 years or a colonoscopy every 10 years starting at age 42. Prostate cancer screening. Recommendations will vary depending on your family history and other risks. Hepatitis C blood test. Hepatitis B blood test. Sexually transmitted disease (STD) testing. Diabetes screening. This is done by checking your blood sugar (glucose) after you have not eaten for a while (fasting). You may have this done every 1-3 years. Abdominal aortic aneurysm (AAA) screening. You may need this if you are a current or former smoker. Osteoporosis. You may be screened starting at age 24 if you are at high risk. Talk with your health care provider about your test  results, treatment options, and if necessary, the need for more tests. Vaccines  Your health care provider may recommend certain vaccines, such  as: Influenza vaccine. This is recommended every year. Tetanus, diphtheria, and acellular pertussis (Tdap, Td) vaccine. You may need a Td booster every 10 years. Zoster vaccine. You may need this after age 42. Pneumococcal 13-valent conjugate (PCV13) vaccine. One dose is recommended after age 2. Pneumococcal polysaccharide (PPSV23) vaccine. One dose is recommended after age 38. Talk to your health care provider about which screenings and vaccines you need and how often you need them. This information is not intended to replace advice given to you by your health care provider. Make sure you discuss any questions you have with your health care provider. Document Released: 12/23/2015 Document Revised: 08/15/2016 Document Reviewed: 09/27/2015 Elsevier Interactive Patient Education  2017 [place] Prevention in the Home Falls can cause injuries. They can happen to people of all ages. There are many things you can do to make your home safe and to help prevent falls. What can I do on the outside of my home? Regularly fix the edges of walkways and driveways and fix any cracks. Remove anything that might make you trip as you walk through a door, such as a raised step or threshold. Trim any bushes or trees on the path to your home. Use bright outdoor lighting. Clear any walking paths of anything that might make someone trip, such as rocks or tools. Regularly check to see if handrails are loose or broken. Make sure that both sides of any steps have handrails. Any raised decks and porches should have guardrails on the edges. Have any leaves, snow, or ice cleared regularly. Use sand or salt on walking paths during winter. Clean up any spills in your garage right away. This includes oil or grease spills. What can I do in the bathroom? Use night lights. Install grab bars by the toilet and in the tub and shower. Do not use towel bars as grab bars. Use non-skid mats or decals in the tub or  shower. If you need to sit down in the shower, use a plastic, non-slip stool. Keep the floor dry. Clean up any water that spills on the floor as soon as it happens. Remove soap buildup in the tub or shower regularly. Attach bath mats securely with double-sided non-slip rug tape. Do not have throw rugs and other things on the floor that can make you trip. What can I do in the bedroom? Use night lights. Make sure that you have a light by your bed that is easy to reach. Do not use any sheets or blankets that are too big for your bed. They should not hang down onto the floor. Have a firm chair that has side arms. You can use this for support while you get dressed. Do not have throw rugs and other things on the floor that can make you trip. What can I do in the kitchen? Clean up any spills right away. Avoid walking on wet floors. Keep items that you use a lot in easy-to-reach places. If you need to reach something above you, use a strong step stool that has a grab bar. Keep electrical cords out of the way. Do not use floor polish or wax that makes floors slippery. If you must use wax, use non-skid floor wax. Do not have throw rugs and other things on the floor that can make you trip. What can I do with my stairs?  Do not leave any items on the stairs. Make sure that there are handrails on both sides of the stairs and use them. Fix handrails that are broken or loose. Make sure that handrails are as long as the stairways. Check any carpeting to make sure that it is firmly attached to the stairs. Fix any carpet that is loose or worn. Avoid having throw rugs at the top or bottom of the stairs. If you do have throw rugs, attach them to the floor with carpet tape. Make sure that you have a light switch at the top of the stairs and the bottom of the stairs. If you do not have them, ask someone to add them for you. What else can I do to help prevent falls? Wear shoes that: Do not have high heels. Have  rubber bottoms. Are comfortable and fit you well. Are closed at the toe. Do not wear sandals. If you use a stepladder: Make sure that it is fully opened. Do not climb a closed stepladder. Make sure that both sides of the stepladder are locked into place. Ask someone to hold it for you, if possible. Clearly mark and make sure that you can see: Any grab bars or handrails. First and last steps. Where the edge of each step is. Use tools that help you move around (mobility aids) if they are needed. These include: Canes. Walkers. Scooters. Crutches. Turn on the lights when you go into a dark area. Replace any light bulbs as soon as they burn out. Set up your furniture so you have a clear path. Avoid moving your furniture around. If any of your floors are uneven, fix them. If there are any pets around you, be aware of where they are. Review your medicines with your doctor. Some medicines can make you feel dizzy. This can increase your chance of falling. Ask your doctor what other things that you can do to help prevent falls. This information is not intended to replace advice given to you by your health care provider. Make sure you discuss any questions you have with your health care provider. Document Released: 09/22/2009 Document Revised: 05/03/2016 Document Reviewed: 12/31/2014 Elsevier Interactive Patient Education  2017 [employer].

## 2022-07-12 ENCOUNTER — Other Ambulatory Visit: Payer: Self-pay | Admitting: Family Medicine

## 2022-07-12 DIAGNOSIS — E1165 Type 2 diabetes mellitus with hyperglycemia: Secondary | ICD-10-CM

## 2022-07-19 ENCOUNTER — Ambulatory Visit: Payer: Self-pay | Admitting: *Deleted

## 2022-07-19 NOTE — Chronic Care Management (AMB) (Signed)
  Chronic Care Management   Note  07/19/2022 Name: Gary Lopez MRN: [id number] DOB: 1951/01/13   Due to changes in the Chronic Care Management program, I am removing myself as the [place] from the Care Team and closing any RN Care Management Care Plans. The patient has not worked with the Consulting civil engineer within the past 6 months. Patient was not scheduled to be followed by the RN Care Coordination nurse for [facility].   Patient does not have an open Care Plan with another CCM team member. Patient does not have a current CCM referral placed since 04/09/22. CCM enrollment status changed to "not enrolled".   Patient's PCP can place a new referral if the they needs Care Management or Care Coordination services in the future.  Chong Sicilian, BSN, RN-BC Proofreader Dial: [phone]

## 2022-07-19 NOTE — Patient Instructions (Signed)
Gary Lopez  At some point during the past 4 years, I have worked with you through the [place] Management Program (CCM) at [facility]. We have not worked together within the past 6 months.   Due to program changes I am removing myself from your care team.   If you are currently active with another CCM Team Member, you will remain active with them unless they reach out to you with additional information.   If you feel that you need services in the future,  please talk with your primary care provider and request a new referral for Care Management or Care Coordination services. This does not affect your status as a patient at [facility].   Thank you for allowing me to participate in your your healthcare journey.  Chong Sicilian, BSN, RN-BC Proofreader Dial: [phone]

## 2022-08-27 ENCOUNTER — Ambulatory Visit (INDEPENDENT_AMBULATORY_CARE_PROVIDER_SITE_OTHER): Payer: Medicare Other | Admitting: Nurse Practitioner

## 2022-08-27 ENCOUNTER — Encounter: Payer: Self-pay | Admitting: Nurse Practitioner

## 2022-08-27 VITALS — BP 123/71 | HR 67 | Temp 98.5°F | Ht 68.0 in | Wt 206.0 lb

## 2022-08-27 DIAGNOSIS — L089 Local infection of the skin and subcutaneous tissue, unspecified: Secondary | ICD-10-CM

## 2022-08-27 DIAGNOSIS — S0590XA Unspecified injury of unspecified eye and orbit, initial encounter: Secondary | ICD-10-CM

## 2022-08-27 DIAGNOSIS — H00024 Hordeolum internum left upper eyelid: Secondary | ICD-10-CM | POA: Diagnosis not present

## 2022-08-27 DIAGNOSIS — S0592XA Unspecified injury of left eye and orbit, initial encounter: Secondary | ICD-10-CM

## 2022-08-27 MED ORDER — CEPHALEXIN 500 MG PO CAPS: 500.0000 mg | ORAL_CAPSULE | Freq: Two times a day (BID) | ORAL | 0 refills | Status: DC

## 2022-08-27 NOTE — Patient Instructions (Signed)
Corneal Abrasion  A corneal abrasion is a scratch or injury to the clear covering over the front of your eye (cornea). This can be painful. It is important to get treatment for a corneal abrasion. If this problem is not treated, it can affect your eyesight (vision). What are the causes? A poke in the eye. An object in the eye. Too much eye rubbing. Very dry eyes. Certain eye infections. Contact lenses that do not fit right or are worn for too long. You can also injure your cornea when putting contact lenses in your eye or taking them out. Eye surgery. Certain cornea problems may increase the chance of a corneal abrasion. Sometimes, the cause is not known. What are the signs or symptoms? Eye pain. The pain may get worse when you open and close your eye or when you move your eye. A feeling of something stuck in your eye. Tearing, redness, and sensitivity to light. Having trouble keeping your eye open, or not being able to keep it open. Blurred vision. Headache. How is this diagnosed? You may work with a health care provider who specializes in conditions of the eye (ophthalmologist). This condition may be diagnosed based on your medical history, symptoms, and an eye exam. How is this treated? Washing out your eye. Removing anything that is stuck in your eye. Using antibiotic drops or ointment to treat or prevent an infection. Using a dilating drop to decrease irritation, swelling, and pain. Using steroid drops or ointment to treat redness, irritation, or swelling. Applying a cold, wet cloth (cold compress) or ice pack to ease the pain Taking pain medicine by mouth. In some cases, an eye patch or bandage soft contact lens might also be used. An eye patch should not be used if the corneal abrasion was related to contact lens wear as it can increase the chance of infection in these eyes. Follow these instructions at home: Medicines Use eye drops or ointments as told by your doctor. If you  were prescribed antibiotic drops or ointment, use them as told by your doctor. Do not stop using the antibiotic even if you start to feel better. Take over-the-counter and prescription medicines only as told by your doctor. Ask your doctor if the medicine prescribed to you: Requires you to avoid driving or using heavy machinery. Can cause trouble pooping (constipation). You may need to take these actions to prevent or treat trouble pooping: Drink enough fluid to keep your pee (urine) pale yellow. Take over-the-counter or prescription medicines. Eat foods that are high in fiber. These include beans, whole grains, and fresh fruits and vegetables. Limit foods that are high in fat and processed sugars. These include fried or sweet foods. Using an eye patch If you have an eye patch, wear it as told by your doctor. Do not drive or use machinery while wearing an eye patch. Follow instructions from your doctor about when to take off the patch. General instructions Ask your doctor if you can use a cold, wet cloth on your eye to help with pain. Do not rub or touch your eye. Do not wash out your eye. Do not wear contact lenses until your doctor says that this is okay. Avoid bright light. Avoid straining your eyes. Keep all follow-up visits as told by your doctor. Doing this can help to prevent infection and loss of eyesight. Contact a doctor if: You keep having eye pain and other symptoms for more than 2 days. You get new symptoms, such as more  redness, watery eyes, or discharge. You have discharge that makes your eyelids stick together in the morning. Your eye patch becomes so loose that you can blink your eye. Symptoms come back after your eye heals. Get help right away if: You have very bad eye pain that does not get better with medicine. You lose eyesight. Summary A corneal abrasion is a scratch or injury to the clear covering over the front of the eye (cornea). It is important to get  treatment for a corneal abrasion. If this problem is not treated, it can affect your eyesight (vision). Use eye drops or ointments as told by your doctor. If you have an eye patch, do not drive or use machinery while wearing it. Let your doctor know if your symptoms last for more than 2 days. This information is not intended to replace advice given to you by your health care provider. Make sure you discuss any questions you have with your health care provider. Document Revised: 04/03/2019 Document Reviewed: 04/03/2019 Elsevier Patient Education  [place].

## 2022-08-27 NOTE — Progress Notes (Addendum)
Acute Office Visit  Subjective:     Patient ID: Gary Lopez, male    DOB: November 16, 1951, 71 y.o.   MRN: [id number]  Chief Complaint  Patient presents with   Eye Injury    Eye Injury  The left eye is affected. This is a new problem. The current episode started yesterday. The problem has been rapidly worsening. The injury mechanism was a direct trauma. The pain is at a severity of 5/10. The pain is moderate. There is No known exposure to pink eye. He Does not wear contacts. Associated symptoms include eye redness. Pertinent negatives include no blurred vision, eye discharge, double vision, fever, foreign body sensation, itching, nausea, photophobia, recent URI or vomiting. He has tried nothing for the symptoms.  Rash This is a new problem. The current episode started yesterday. The problem has been gradually worsening since onset. The affected locations include the face. The rash is characterized by dryness, itchiness, redness and scaling. Associated with: Metal wire. Associated symptoms include eye pain. Pertinent negatives include no congestion, cough, facial edema, fever, shortness of breath or vomiting. Past treatments include nothing.     Review of Systems  Constitutional:  Negative for fever.  HENT: Negative.  Negative for congestion.   Eyes:  Positive for pain and redness. Negative for blurred vision, double vision, photophobia, discharge and itching.  Respiratory: Negative.  Negative for cough and shortness of breath.   Cardiovascular: Negative.   Gastrointestinal:  Negative for nausea and vomiting.  Skin:  Positive for rash.  All other systems reviewed and are negative.       Objective:    BP 123/71   Pulse 67   Temp 98.5 F (36.9 C)   Ht '5\' 8"'$  (1.727 m)   Wt 206 lb (93.4 kg)   SpO2 97%   BMI 31.32 kg/m  BP Readings from Last 3 Encounters:  08/27/22 123/71  05/11/22 (!) 141/85  01/11/22 130/82      Physical Exam Vitals and nursing note reviewed.   Constitutional:      Appearance: Normal appearance.  HENT:     Head: Normocephalic.     Right Ear: External ear normal.     Left Ear: External ear normal.     Nose: Nose normal.  Eyes:     Conjunctiva/sclera:     Left eye: Left conjunctiva is injected.      Comments: Left eye pain, swelling and red sclera present  Cardiovascular:     Rate and Rhythm: Normal rate and regular rhythm.     Pulses: Normal pulses.     Heart sounds: Normal heart sounds.  Pulmonary:     Effort: Pulmonary effort is normal.     Breath sounds: Normal breath sounds.  Abdominal:     General: Bowel sounds are normal.  Skin:    General: Skin is warm.     Findings: Rash present.  Neurological:     Mental Status: He is alert and oriented to person, place, and time.     No results found for any visits on 08/27/22.      Assessment & Plan:  Completed assessment patient presents with eye trauma/pain from running into a wire clothing hanger while mowing his lawn.  Skin around the area is red with a rash that looks infected.  Started patient on Keflex for skin infection.  Ibuprofen for pain, cool compress, advised patient to walk into [facility] for reevaluation.  Patient verbalized understanding.  And knows to  follow-up with worsening unresolved symptoms. Problem List Items Addressed This Visit   None Visit Diagnoses     Eye trauma    -  Primary   Skin infection       Relevant Medications   cephALEXin (KEFLEX) 500 MG capsule       Meds ordered this encounter  Medications   cephALEXin (KEFLEX) 500 MG capsule    Sig: Take 1 capsule (500 mg total) by mouth 2 (two) times daily.    Dispense:  14 capsule    Refill:  0    Order Specific Question:   Supervising Provider    Answer:   Claretta Fraise [[id number]]    Return if symptoms worsen or fail to improve.  Ivy Lynn, NP

## 2022-08-28 ENCOUNTER — Telehealth: Payer: Self-pay | Admitting: Family Medicine

## 2022-08-28 NOTE — Telephone Encounter (Signed)
R/c

## 2022-08-28 NOTE — Telephone Encounter (Signed)
Pt aware he can d/c taking antibiotic

## 2022-08-28 NOTE — Telephone Encounter (Signed)
Lmtcb.

## 2022-09-03 DIAGNOSIS — H10013 Acute follicular conjunctivitis, bilateral: Secondary | ICD-10-CM | POA: Diagnosis not present

## 2022-09-03 NOTE — Telephone Encounter (Signed)
Yes I would continue both just in case, sometimes shingles can have a bacterial infection on top of it so just finish the full course of both

## 2022-09-03 NOTE — Telephone Encounter (Signed)
Spouse says that eye dr rechecked pt today--the eye is not damage but pt is having a headaches. Can pt get a rx for amitriptyline--for headaches? The eye dr Reubin Milan write the rx and advised pt to call pcp to prescribe. Use [facility]. Spouse aware Lajuana Ripple will be back tomorrow.

## 2022-09-03 NOTE — Telephone Encounter (Signed)
[name] informed and understood.  Would like the amitriptyline called in as well. Dr. Warrick Parisian has left for the day. Will route to Dr. Darnell Level for tomorrow to advise.  [name] is aware that she will receive a call tomorrow.

## 2022-09-04 ENCOUNTER — Encounter: Payer: Self-pay | Admitting: Family Medicine

## 2022-09-04 ENCOUNTER — Other Ambulatory Visit: Payer: Self-pay | Admitting: Family Medicine

## 2022-09-04 DIAGNOSIS — B0230 Zoster ocular disease, unspecified: Secondary | ICD-10-CM

## 2022-09-04 MED ORDER — GABAPENTIN 300 MG PO CAPS: 300.0000 mg | ORAL_CAPSULE | Freq: Three times a day (TID) | ORAL | 2 refills | Status: DC | PRN

## 2022-09-04 NOTE — Telephone Encounter (Signed)
Called pt. See separate note

## 2022-09-04 NOTE — Progress Notes (Signed)
Spoke to the patient and his wife.  I recommend that he start with gabapentin first and if this is not helpful and/or he has side effects then we can transition over to the amitriptyline since that does carry higher risk.  We discussed potential risk of arrhythmia etc, given greater than 72 year old.  Discussed gradually increasing the gabapentin to 3 times daily if needed.  Start with nightly for the next several days and then advance to twice daily for several days and then to 3 times daily if needed.  Would like to see him back in the next few months and he is actually already scheduled in December with me.  Keep appointment but contact me sooner if symptoms or not getting better or if he is having any unusual side effects.  Certainly recommend that he get shingles vaccination to prevent/reduce risk of recurrence.  Meds ordered this encounter  Medications   gabapentin (NEURONTIN) 300 MG capsule    Sig: Take 1 capsule (300 mg total) by mouth 3 (three) times daily as needed.    Dispense:  90 capsule    Refill:  2

## 2022-11-12 ENCOUNTER — Ambulatory Visit (INDEPENDENT_AMBULATORY_CARE_PROVIDER_SITE_OTHER): Payer: Medicare Other | Admitting: Family Medicine

## 2022-11-12 ENCOUNTER — Encounter: Payer: Self-pay | Admitting: Family Medicine

## 2022-11-12 VITALS — BP 130/80 | HR 80 | Temp 97.3°F | Ht 68.0 in | Wt 207.6 lb

## 2022-11-12 DIAGNOSIS — Z0001 Encounter for general adult medical examination with abnormal findings: Secondary | ICD-10-CM

## 2022-11-12 DIAGNOSIS — Z0000 Encounter for general adult medical examination without abnormal findings: Secondary | ICD-10-CM

## 2022-11-12 DIAGNOSIS — Z23 Encounter for immunization: Secondary | ICD-10-CM

## 2022-11-12 DIAGNOSIS — E1169 Type 2 diabetes mellitus with other specified complication: Secondary | ICD-10-CM | POA: Diagnosis not present

## 2022-11-12 DIAGNOSIS — Z6379 Other stressful life events affecting family and household: Secondary | ICD-10-CM

## 2022-11-12 DIAGNOSIS — F99 Mental disorder, not otherwise specified: Secondary | ICD-10-CM

## 2022-11-12 DIAGNOSIS — I152 Hypertension secondary to endocrine disorders: Secondary | ICD-10-CM

## 2022-11-12 DIAGNOSIS — E1159 Type 2 diabetes mellitus with other circulatory complications: Secondary | ICD-10-CM

## 2022-11-12 DIAGNOSIS — E1165 Type 2 diabetes mellitus with hyperglycemia: Secondary | ICD-10-CM | POA: Diagnosis not present

## 2022-11-12 DIAGNOSIS — E785 Hyperlipidemia, unspecified: Secondary | ICD-10-CM | POA: Diagnosis not present

## 2022-11-12 DIAGNOSIS — E11628 Type 2 diabetes mellitus with other skin complications: Secondary | ICD-10-CM | POA: Diagnosis not present

## 2022-11-12 LAB — BAYER DCA HB A1C WAIVED: HB A1C (BAYER DCA - WAIVED): 8.1 % — ABNORMAL HIGH (ref 4.8–5.6)

## 2022-11-12 MED ORDER — ATORVASTATIN CALCIUM 40 MG PO TABS: 40.0000 mg | ORAL_TABLET | Freq: Every day | ORAL | 3 refills | Status: DC

## 2022-11-12 MED ORDER — LOSARTAN POTASSIUM 50 MG PO TABS: 50.0000 mg | ORAL_TABLET | Freq: Every day | ORAL | 3 refills | Status: DC

## 2022-11-12 MED ORDER — AMLODIPINE BESYLATE 10 MG PO TABS: 10.0000 mg | ORAL_TABLET | Freq: Every day | ORAL | 3 refills | Status: DC

## 2022-11-12 MED ORDER — METFORMIN HCL ER 750 MG PO TB24: 750.0000 mg | ORAL_TABLET | Freq: Two times a day (BID) | ORAL | 1 refills | Status: DC

## 2022-11-12 NOTE — Progress Notes (Signed)
Gary Lopez is a 71 y.o. male presents to office today for annual physical exam examination.    Concerns today include: 1. .Type 2 Diabetes with hypertension, hyperlipidemia:  He reports compliance with his metformin, Cozaar, Lipitor and amlodipine.  Had some gabapentin that was given to him for postherpetic neuralgia but that has since resolved and he discontinued the medication shortly after initiation.  His blood sugars are roughly running 140s.  He admits that he has been indulging in sweets over the holidays and that his sugars are likely up.  Denies any polydipsia, vision changes, chest pain or shortness of breath but does report some increased urinary frequency  Last eye exam: Dr Marin Comment Last foot exam: Needs Last A1c:  Lab Results  Component Value Date   HGBA1C 6.6 (H) 05/11/2022   Nephropathy screen indicated?:  Needs Last flu, zoster and/or pneumovax:  Immunization History  Administered Date(s) Administered   Fluad Quad(high Dose 65+) 10/24/2019, 10/11/2021   Moderna Sars-Covid-2 Vaccination 03/17/2020, 04/14/2020   Pneumococcal Conjugate-13 11/24/2018   Pneumococcal Polysaccharide-23 02/17/2021   Tdap 03/22/2006, 11/24/2018    Occupation: retired, Marital status: married, Substance use: none Diet: typical [place], Exercise:  no structured/ tinkers outside Last eye exam: Dr Marin Comment after Shingles Last dental exam: missing teeth Last colonoscopy: [facility] Refills needed today: all Immunizations needed: Immunization History  Administered Date(s) Administered   Fluad Quad(high Dose 65+) 10/24/2019, 10/11/2021   Moderna Sars-Covid-2 Vaccination 03/17/2020, 04/14/2020   Pneumococcal Conjugate-13 11/24/2018   Pneumococcal Polysaccharide-23 02/17/2021   Tdap 03/22/2006, 11/24/2018     Past Medical History:  Diagnosis Date   Abnormal EKG    left bundle branch blcok ekg 06-24-2019 saw dr Domenic Polite cardiology for   Arthritis    oa   Heart murmur    History of pneumonia     20 years ago   Hypertension    Type 2 diabetes mellitus ([place])    Diet controlled   Social History   Socioeconomic History   Marital status: Married    Spouse name: [name]   Number of children: 2   Years of education: Not on file   Highest education level: Not on file  Occupational History   Occupation: Truck Geophysicist/field seismologist    Comment: retired  Tobacco Use   Smoking status: Never   Smokeless tobacco: Never  Vaping Use   Vaping Use: Never used  Substance and Sexual Activity   Alcohol use: Not Currently   Drug use: Not Currently   Sexual activity: Not Currently  Other Topics Concern   Not on file  Social History Narrative   Lives home with wife. They have 2 children - one 2 hours away, one in [place].   Social Determinants of Health   Financial Resource Strain: Low Risk  (06/19/2022)   Overall Financial Resource Strain (CARDIA)    Difficulty of Paying Living Expenses: Not hard at all  Food Insecurity: No Food Insecurity (06/19/2022)   Hunger Vital Sign    Worried About Running Out of Food in the Last Year: Never true    Ran Out of Food in the Last Year: Never true  Transportation Needs: No Transportation Needs (06/19/2022)   PRAPARE - Hydrologist (Medical): No    Lack of Transportation (Non-Medical): No  Physical Activity: Sufficiently Active (06/19/2022)   Exercise Vital Sign    Days of Exercise per Week: 6 days    Minutes of Exercise per Session: 90 min  Stress: No Stress  Concern Present (06/19/2022)   [place]    Feeling of Stress : Not at all  Social Connections: Moderately Isolated (06/19/2022)   Social Connection and Isolation Panel [NHANES]    Frequency of Communication with Friends and Family: More than three times a week    Frequency of Social Gatherings with Friends and Family: More than three times a week    Attends Religious Services: Never    Marine scientist or  Organizations: No    Attends Archivist Meetings: Never    Marital Status: Married  Human resources officer Violence: Not At Risk (06/19/2022)   Humiliation, Afraid, Rape, and Kick questionnaire    Fear of Current or Ex-Partner: No    Emotionally Abused: No    Physically Abused: No    Sexually Abused: No   Past Surgical History:  Procedure Laterality Date   right wrist surgery  yrs ago   [place] 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   Procedure: [place];  Surgeon: Rod Can, MD;  Location: [facility];  Service: Orthopedics;  Laterality: Left;   Family History  Problem Relation Age of Onset   Heart disease Mother    COPD Father     Current Outpatient Medications:    ACCU-CHEK GUIDE test strip, USE 1 STRIP TO CHECK GLUCOSE UP TO TWICE DAILY, Disp: 100 each, Rfl: 9   Accu-Chek Softclix Lancets lancets, USE 1  TO CHECK GLUCOSE UP TO TWICE DAILY, Disp: 100 each, Rfl: 9   amLODipine (NORVASC) 10 MG tablet, Take 1 tablet (10 mg total) by mouth daily., Disp: 90 tablet, Rfl: 3   atorvastatin (LIPITOR) 40 MG tablet, Take 1 tablet (40 mg total) by mouth daily., Disp: 90 tablet, Rfl: 3   blood glucose meter kit and supplies, Dispense based on patient and insurance preference. Use up to 2 times daily as directed. (FOR ICD-10 E11.65, Disp: 1 each, Rfl: 0   gabapentin (NEURONTIN) 300 MG capsule, Take 1 capsule (300 mg total) by mouth 3 (three) times daily as needed., Disp: 90 capsule, Rfl: 2   losartan (COZAAR) 50 MG tablet, Take 1 tablet (50 mg total) by mouth daily., Disp: 90 tablet, Rfl: 3   metFORMIN (GLUCOPHAGE XR) 500 MG 24 hr tablet, Take 1 tablet (500 mg total) by mouth 2 (two) times daily with a meal., Disp: 180 tablet, Rfl: 3  No Known Allergies   ROS: Review of Systems Pertinent items noted in HPI and remainder of comprehensive ROS otherwise negative.    Physical exam BP 130/80   Pulse 80   Temp (!) 97.3 F (36.3 C)   Ht _0  (1.727 m)   Wt 207 lb  9.6 oz (94.2 kg)   SpO2 97%   BMI 31.57 kg/m  General appearance: alert, cooperative, appears stated age, and no distress Head: Normocephalic, without obvious abnormality, atraumatic Eyes: negative findings: lids and lashes normal, conjunctivae and sclerae normal, corneas clear, and pupils equal, round, reactive to light and accomodation Ears: normal TM's and external ear canals both ears Nose: Nares normal. Septum midline. Mucosa normal. No drainage or sinus tenderness. Throat:  Several teeth missing.  Oropharynx without masses or erythema Neck: no adenopathy, supple, symmetrical, trachea midline, and thyroid not enlarged, symmetric, no tenderness/mass/nodules Back: symmetric, no curvature. ROM normal. No CVA tenderness. Lungs: clear to auscultation bilaterally Chest wall: no tenderness Heart:  Regular rate and rhythm.  S1-S2 heard.  Systolic murmur appreciated that radiates to  the left carotid Abdomen: obese Extremities: extremities normal, atraumatic, no cyanosis or edema Pulses: 2+ and symmetric Skin: Skin color, texture, turgor normal. No rashes or lesions Lymph nodes: Cervical, supraclavicular, and axillary nodes normal. Neurologic: see MMSE    11/12/2022    8:47 AM 06/19/2022    9:07 AM 05/11/2022    8:08 AM  Depression screen PHQ 2/9  Decreased Interest 0 0 0  Down, Depressed, Hopeless 0 0 0  PHQ - 2 Score 0 0 0          Assessment/ Plan: [name] here for annual physical exam.   Annual physical exam  Uncontrolled type 2 diabetes mellitus with hyperglycemia ([place]) - Plan: Microalbumin / creatinine urine ratio, Bayer DCA Hb A1c Waived, metFORMIN (GLUCOPHAGE-XR) 750 MG 24 hr tablet  Hypertension associated with diabetes ([place]) - Plan: losartan (COZAAR) 50 MG tablet, amLODipine (NORVASC) 10 MG tablet  Hyperlipidemia due to type 2 diabetes mellitus ([place]) - Plan: atorvastatin (LIPITOR) 40 MG tablet  Abnormal MMSE  Need for immunization against influenza - Plan:  Flu Vaccine QUAD High Dose(Fluad)  Stress due to illness of family member  Declines colon cancer screening.  Had shingles vaccinations.  Flu vaccine updated  Sugar remains uncontrolled.  Metformin increased to 750 mg extended release twice daily.  Check urine microalbumin.  Plan for foot exam at next visit  Blood pressure is controlled.  No changes.  Refill sent  Not due for fasting lipid.  Lipitor renewed  MMSE was collected today and both MMSE and MoCA abnormal.  Difficult to tell if this is due to memory issues or due to educational barriers.  I did offer referral to neurology but he declined this today.  Having some stress related to his wife's cancer and does not wish to see any specialist at this time  Counseled on healthy lifestyle choices, including diet (rich in fruits, vegetables and lean meats and low in salt and simple carbohydrates) and exercise (at least 30 minutes of moderate physical activity daily).  Patient to follow up in 44m Hanad Leino M. GLajuana Ripple DO

## 2022-11-13 LAB — MICROALBUMIN / CREATININE URINE RATIO
Creatinine, Urine: 154.1 mg/dL
Microalb/Creat Ratio: 5 mg/g creat (ref 0–29)
Microalbumin, Urine: 7.1 ug/mL

## 2023-02-13 ENCOUNTER — Encounter: Payer: Self-pay | Admitting: Family Medicine

## 2023-02-13 ENCOUNTER — Ambulatory Visit (INDEPENDENT_AMBULATORY_CARE_PROVIDER_SITE_OTHER): Payer: Medicare Other | Admitting: Family Medicine

## 2023-02-13 VITALS — BP 130/79 | HR 89 | Temp 98.4°F | Ht 68.0 in | Wt 207.0 lb

## 2023-02-13 DIAGNOSIS — E1159 Type 2 diabetes mellitus with other circulatory complications: Secondary | ICD-10-CM | POA: Diagnosis not present

## 2023-02-13 DIAGNOSIS — Z6379 Other stressful life events affecting family and household: Secondary | ICD-10-CM | POA: Diagnosis not present

## 2023-02-13 DIAGNOSIS — E1169 Type 2 diabetes mellitus with other specified complication: Secondary | ICD-10-CM | POA: Diagnosis not present

## 2023-02-13 DIAGNOSIS — E785 Hyperlipidemia, unspecified: Secondary | ICD-10-CM | POA: Diagnosis not present

## 2023-02-13 DIAGNOSIS — E1165 Type 2 diabetes mellitus with hyperglycemia: Secondary | ICD-10-CM | POA: Diagnosis not present

## 2023-02-13 DIAGNOSIS — I152 Hypertension secondary to endocrine disorders: Secondary | ICD-10-CM

## 2023-02-13 DIAGNOSIS — B351 Tinea unguium: Secondary | ICD-10-CM | POA: Diagnosis not present

## 2023-02-13 LAB — BASIC METABOLIC PANEL
BUN/Creatinine Ratio: 18 (ref 10–24)
BUN: 17 mg/dL (ref 8–27)
CO2: 23 mmol/L (ref 20–29)
Calcium: 9.4 mg/dL (ref 8.6–10.2)
Chloride: 101 mmol/L (ref 96–106)
Creatinine, Ser: 0.97 mg/dL (ref 0.76–1.27)
Glucose: 178 mg/dL — ABNORMAL HIGH (ref 70–99)
Potassium: 4.6 mmol/L (ref 3.5–5.2)
Sodium: 139 mmol/L (ref 134–144)
eGFR: 83 mL/min/{1.73_m2} (ref >59)

## 2023-02-13 LAB — BAYER DCA HB A1C WAIVED: HB A1C (BAYER DCA - WAIVED): 7.4 % — ABNORMAL HIGH (ref 4.8–5.6)

## 2023-02-13 NOTE — Progress Notes (Signed)
Subjective: CC:DM PCP: Janora Norlander, DO HN:[id number] Gary Lopez is a 72 y.o. male presenting to clinic today for:  1. Type 2 Diabetes with hypertension, hyperlipidemia:  He is compliant with increased dose of metformin to 750 mg twice daily.  He continues to take his Lipitor, Norvasc and Cozaar as directed.  He continues to walk daily but admits that he eats biscuits, potatoes and lots of grapes.  Last eye exam: needs Last foot exam: needs Last A1c:  Lab Results  Component Value Date   HGBA1C 8.1 (H) 11/12/2022   Nephropathy screen indicated?: needs Last flu, zoster and/or pneumovax:  Immunization History  Administered Date(s) Administered   Fluad Quad(high Dose 65+) 10/24/2019, 10/11/2021, 11/12/2022   Moderna Sars-Covid-2 Vaccination 03/17/2020, 04/14/2020   Pneumococcal Conjugate-13 11/24/2018   Pneumococcal Polysaccharide-23 02/17/2021   Tdap 03/22/2006, 11/24/2018    ROS: Denies dizziness, LOC, polyuria, polydipsia, unintended weight loss/gain, foot ulcerations, numbness or tingling in extremities, shortness of breath or chest pain.  Continues to have stress related to his wife's cancer diagnosis.    ROS: Per HPI  No Known Allergies Past Medical History:  Diagnosis Date   Abnormal EKG    left bundle branch blcok ekg 06-24-2019 saw dr Domenic Polite cardiology for   Arthritis    oa   Heart murmur    History of pneumonia    20 years ago   Hypertension    Type 2 diabetes mellitus ([place])    Diet controlled    Current Outpatient Medications:    ACCU-CHEK GUIDE test strip, USE 1 STRIP TO CHECK GLUCOSE UP TO TWICE DAILY, Disp: 100 each, Rfl: 9   Accu-Chek Softclix Lancets lancets, USE 1  TO CHECK GLUCOSE UP TO TWICE DAILY, Disp: 100 each, Rfl: 9   amLODipine (NORVASC) 10 MG tablet, Take 1 tablet (10 mg total) by mouth daily., Disp: 90 tablet, Rfl: 3   atorvastatin (LIPITOR) 40 MG tablet, Take 1 tablet (40 mg total) by mouth daily., Disp: 90 tablet, Rfl: 3   blood  glucose meter kit and supplies, Dispense based on patient and insurance preference. Use up to 2 times daily as directed. (FOR ICD-10 E11.65, Disp: 1 each, Rfl: 0   gabapentin (NEURONTIN) 300 MG capsule, Take 1 capsule (300 mg total) by mouth 3 (three) times daily as needed., Disp: 90 capsule, Rfl: 2   losartan (COZAAR) 50 MG tablet, Take 1 tablet (50 mg total) by mouth daily., Disp: 90 tablet, Rfl: 3   metFORMIN (GLUCOPHAGE-XR) 750 MG 24 hr tablet, Take 1 tablet (750 mg total) by mouth in the morning and at bedtime., Disp: 180 tablet, Rfl: 1 Social History   Socioeconomic History   Marital status: Married    Spouse name: Gary Lopez   Number of children: 2   Years of education: Not on file   Highest education level: Not on file  Occupational History   Occupation: Truck Geophysicist/field seismologist    Comment: retired  Tobacco Use   Smoking status: Never   Smokeless tobacco: Never  Scientific laboratory technician Use: Never used  Substance and Sexual Activity   Alcohol use: Not Currently   Drug use: Not Currently   Sexual activity: Not Currently  Other Topics Concern   Not on file  Social History Narrative   Lives home with wife. They have 2 children - one 2 hours away, one in [place].   Social Determinants of Health   Financial Resource Strain: Low Risk  (06/19/2022)   Overall Financial Resource  Strain (CARDIA)    Difficulty of Paying Living Expenses: Not hard at all  Food Insecurity: No Food Insecurity (06/19/2022)   Hunger Vital Sign    Worried About Running Out of Food in the Last Year: Never true    Ran Out of Food in the Last Year: Never true  Transportation Needs: No Transportation Needs (06/19/2022)   PRAPARE - Hydrologist (Medical): No    Lack of Transportation (Non-Medical): No  Physical Activity: Sufficiently Active (06/19/2022)   Exercise Vital Sign    Days of Exercise per Week: 6 days    Minutes of Exercise per Session: 90 min  Stress: No Stress Concern Present (06/19/2022)    [place]    Feeling of Stress : Not at all  Social Connections: Moderately Isolated (06/19/2022)   Social Connection and Isolation Panel [NHANES]    Frequency of Communication with Friends and Family: More than three times a week    Frequency of Social Gatherings with Friends and Family: More than three times a week    Attends Religious Services: Never    Marine scientist or Organizations: No    Attends Archivist Meetings: Never    Marital Status: Married  Human resources officer Violence: Not At Risk (06/19/2022)   Humiliation, Afraid, Rape, and Kick questionnaire    Fear of Current or Ex-Partner: No    Emotionally Abused: No    Physically Abused: No    Sexually Abused: No   Family History  Problem Relation Age of Onset   Heart disease Mother    COPD Father     Objective: Office vital signs reviewed. BP 130/79   Pulse 89   Temp 98.4 F (36.9 C)   Ht '5\' 8"'$  (1.727 m)   Wt 207 lb (93.9 kg)   SpO2 96%   BMI 31.47 kg/m   Physical Examination:  General: Awake, alert, well nourished, No acute distress HEENT:sclera white, MMM Cardio: regular rate and rhythm, S1S2 heard, no murmurs appreciated Pulm: clear to auscultation bilaterally, no wheezes, rhonchi or rales; normal work of breathing on room air Psych: mood down Neuro: see DM foot  Diabetic Foot Exam - Simple   Simple Foot Form Diabetic Foot exam was performed with the following findings: Yes 02/13/2023  8:49 AM  Visual Inspection See comments: Yes Sensation Testing Intact to touch and monofilament testing bilaterally: Yes Pulse Check Posterior Tibialis and Dorsalis pulse intact bilaterally: Yes Comments Marked thickening of the nails bilaterally, particularly at the great toes.  Onychomycotic changes throughout.     Assessment/ Plan: 72 y.o. male   Uncontrolled type 2 diabetes mellitus with hyperglycemia ([place]) - Plan: Microalbumin /  creatinine urine ratio, Bayer DCA Hb A1c Waived  Hyperlipidemia due to type 2 diabetes mellitus ([place])  Hypertension associated with diabetes ([place]) - Plan: Basic Metabolic Panel  Stress due to illness of family member  Onychomycosis of toenail  Sugar improving but remains uncontrolled at 7.4 today.  Urine microalbumin collected.  Check renal function.  He is going to really try and cut down the carbohydrate intake to avoid extra medicine but medications discussed today include glipizide versus Janumet  Continue statin and blood pressure medications.  Not due for fasting lipid and blood pressure is controlled with current regimen.  Continues to have stress due to the illness of his wife and unfortunately she has not really responded to any of the treatments for her cancer.  He declines any interventions at this time for his mood  Considering referral to podiatry versus medication.  He will let me know by next visit which he would like to proceed with  No orders of the defined types were placed in this encounter.  No orders of the defined types were placed in this encounter.    Janora Norlander, DO [place] [phone]

## 2023-02-13 NOTE — Patient Instructions (Signed)
Sugar slightly better but still not at goal. Your A1c goal is LESS than 7.0. You were 7.6 today. We talked about getting rid of biscuits/ potatoes/ crackers/ bread/ rice/ sweets Otherwise, plan for [place] to add medication next visit 3 options for nails: - see podiatry to get them shaved down and treated - start lamisil pills (have to come in 1.5 months for blood work) - start Estate agent for nails (can take several months to work but no labs needed for this one)

## 2023-02-15 LAB — MICROALBUMIN / CREATININE URINE RATIO
Creatinine, Urine: 238.8 mg/dL
Microalb/Creat Ratio: 6 mg/g creat (ref 0–29)
Microalbumin, Urine: 13.7 ug/mL

## 2023-05-06 ENCOUNTER — Other Ambulatory Visit: Payer: Self-pay | Admitting: Family Medicine

## 2023-05-06 DIAGNOSIS — E1165 Type 2 diabetes mellitus with hyperglycemia: Secondary | ICD-10-CM

## 2023-05-21 ENCOUNTER — Encounter: Payer: Self-pay | Admitting: Family Medicine

## 2023-05-21 ENCOUNTER — Ambulatory Visit (INDEPENDENT_AMBULATORY_CARE_PROVIDER_SITE_OTHER): Payer: Medicare Other | Admitting: Family Medicine

## 2023-05-21 VITALS — BP 123/76 | HR 82 | Temp 98.6°F | Ht 68.0 in | Wt 201.0 lb

## 2023-05-21 DIAGNOSIS — Z0001 Encounter for general adult medical examination with abnormal findings: Secondary | ICD-10-CM | POA: Diagnosis not present

## 2023-05-21 DIAGNOSIS — Z6379 Other stressful life events affecting family and household: Secondary | ICD-10-CM | POA: Diagnosis not present

## 2023-05-21 DIAGNOSIS — E1159 Type 2 diabetes mellitus with other circulatory complications: Secondary | ICD-10-CM | POA: Diagnosis not present

## 2023-05-21 DIAGNOSIS — E1169 Type 2 diabetes mellitus with other specified complication: Secondary | ICD-10-CM

## 2023-05-21 DIAGNOSIS — Z7984 Long term (current) use of oral hypoglycemic drugs: Secondary | ICD-10-CM

## 2023-05-21 DIAGNOSIS — E1165 Type 2 diabetes mellitus with hyperglycemia: Secondary | ICD-10-CM | POA: Diagnosis not present

## 2023-05-21 DIAGNOSIS — Z125 Encounter for screening for malignant neoplasm of prostate: Secondary | ICD-10-CM

## 2023-05-21 DIAGNOSIS — I152 Hypertension secondary to endocrine disorders: Secondary | ICD-10-CM | POA: Diagnosis not present

## 2023-05-21 DIAGNOSIS — E785 Hyperlipidemia, unspecified: Secondary | ICD-10-CM | POA: Diagnosis not present

## 2023-05-21 DIAGNOSIS — Z0000 Encounter for general adult medical examination without abnormal findings: Secondary | ICD-10-CM

## 2023-05-21 LAB — CBC
Hemoglobin: 16.4 g/dL (ref 13.0–17.7)
MCV: 89 fL (ref 79–97)
WBC: 8.4 10*3/uL (ref 3.4–10.8)

## 2023-05-21 LAB — BAYER DCA HB A1C WAIVED: HB A1C (BAYER DCA - WAIVED): 7 % — ABNORMAL HIGH (ref 4.8–5.6)

## 2023-05-21 MED ORDER — METFORMIN HCL ER 500 MG PO TB24
1000.0000 mg | ORAL_TABLET | Freq: Two times a day (BID) | ORAL | 3 refills | Status: DC
Start: 2023-05-21 — End: 2023-09-23

## 2023-05-21 NOTE — Progress Notes (Signed)
Gary Lopez is a 72 y.o. male presents to office today for annual physical exam examination.    Concerns today include: 1. Type 2 Diabetes with hypertension, hyperlipidemia:  Compliant with his increased dose of metformin.  He notes no hypoglycemic episodes and denies any blood sugars substantially over 200.  He is compliant with statin, blood pressure medication.  Last eye exam: needs Last foot exam: UTD Last A1c:  Lab Results  Component Value Date   HGBA1C 7.4 (H) 02/13/2023   Nephropathy screen indicated?: UTD Last flu, zoster and/or pneumovax:  Immunization History  Administered Date(s) Administered   Fluad Quad(high Dose 65+) 10/24/2019, 10/11/2021, 11/12/2022   Moderna Sars-Covid-2 Vaccination 03/17/2020, 04/14/2020   Pneumococcal Conjugate-13 11/24/2018   Pneumococcal Polysaccharide-23 02/17/2021   Tdap 03/22/2006, 11/24/2018    ROS: Denies dizziness, LOC, polyuria, polydipsia, unintended weight loss/gain, foot ulcerations, numbness or tingling in extremities, shortness of breath or chest pain.   Occupation: Retired, Marital status: Married, Substance use: None Diet: Tries to avoid excess carbs, Exercise: Walks 4 to 6 miles per day usually 5 to 6 days/week Last colonoscopy: Declines Refills needed today: None Immunizations needed: shingles Immunization History  Administered Date(s) Administered   Fluad Quad(high Dose 65+) 10/24/2019, 10/11/2021, 11/12/2022   Moderna Sars-Covid-2 Vaccination 03/17/2020, 04/14/2020   Pneumococcal Conjugate-13 11/24/2018   Pneumococcal Polysaccharide-23 02/17/2021   Tdap 03/22/2006, 11/24/2018     Past Medical History:  Diagnosis Date   Abnormal EKG    left bundle branch blcok ekg 06-24-2019 saw dr Diona Browner cardiology for   Arthritis    oa   Heart murmur    History of pneumonia    20 years ago   Hypertension    Type 2 diabetes mellitus (HCC)    Diet controlled   Social History   Socioeconomic History   Marital  status: Married    Spouse name: Burna Mortimer   Number of children: 2   Years of education: Not on file   Highest education level: Not on file  Occupational History   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 They have 2 children - one 2 hours away, one in [place].   Social Determinants of Health   Financial Resource Strain: Low Risk  (06/19/2022)   Overall Financial Resource Strain (CARDIA)    Difficulty of Paying Living Expenses: Not hard at all  Food Insecurity: No Food Insecurity (06/19/2022)   Hunger Vital Sign    Worried About Running Out of Food in the Last Year: Never true    Ran Out of Food in the Last Year: Never true  Transportation Needs: No Transportation Needs (06/19/2022)   PRAPARE - Administrator, Civil Service (Medical): No    Lack of Transportation (Non-Medical): No  Physical Activity: Sufficiently Active (06/19/2022)   Exercise Vital Sign    Days of Exercise per Week: 6 days    Minutes of Exercise per Session: 90 min  Stress: No Stress Concern Present (06/19/2022)   [employer] of Occupational Health - Occupational Stress Questionnaire    Feeling of Stress : Not at all  Social Connections: Moderately Isolated (  06/19/2022)   Social Connection and Isolation Panel [NHANES]    Frequency of Communication with Friends and Family: More than three times a week    Frequency of Social Gatherings with Friends and Family: More than three times a week    Attends Religious Services: Never    Database administrator or Organizations: No    Attends Banker Meetings: Never    Marital Status: Married  Catering manager Violence: Not At Risk  (06/19/2022)   Humiliation, Afraid, Rape, and Kick questionnaire    Fear of Current or Ex-Partner: No    Emotionally Abused: No    Physically Abused: No    Sexually Abused: No   Past Surgical History:  Procedure Laterality Date   right wrist surgery  yrs ago   TOTAL HIP ARTHROPLASTY 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   Procedure: TOTAL HIP ARTHROPLASTY ANTERIOR APPROACH;  Surgeon: Samson Frederic, MD;  Location: [facility];  Service: Orthopedics;  Laterality: Left;   Family History  Problem Relation Age of Onset   Heart disease Mother    COPD Father     Current Outpatient Medications:    ACCU-CHEK GUIDE test strip, USE 1 STRIP TO CHECK GLUCOSE UP TO TWICE DAILY, Disp: 100 each, Rfl: 9   Accu-Chek Softclix Lancets lancets, USE 1  TO CHECK GLUCOSE UP TO TWICE DAILY, Disp: 100 each, Rfl: 9   amLODipine (NORVASC) 10 MG tablet, Take 1 tablet (10 mg total) by mouth daily., Disp: 90 tablet, Rfl: 3   atorvastatin (LIPITOR) 40 MG tablet, Take 1 tablet (40 mg total) by mouth daily., Disp: 90 tablet, Rfl: 3   blood glucose meter kit and supplies, Dispense based on patient and insurance preference. Use up to 2 times daily as directed. (FOR ICD-10 E11.65, Disp: 1 each, Rfl: 0   gabapentin (NEURONTIN) 300 MG capsule, Take 1 capsule (300 mg total) by mouth 3 (three) times daily as needed., Disp: 90 capsule, Rfl: 2   losartan (COZAAR) 50 MG tablet, Take 1 tablet (50 mg total) by mouth daily., Disp: 90 tablet, Rfl: 3   metFORMIN (GLUCOPHAGE-XR) 750 MG 24 hr tablet, TAKE 1 TABLET BY MOUTH IN THE MORNING AND AT BEDTIME, Disp: 180 tablet, Rfl: 0  No Known Allergies   ROS: Review of Systems Pertinent items noted in HPI and remainder of comprehensive ROS otherwise negative.    Physical exam BP 123/76   Pulse 82   Temp 98.6 F (37 C)   Ht 5\' 8"  (1.727 m)   Wt 201 lb (91.2 kg)   SpO2 94%   BMI 30.56 kg/m  General appearance: alert, cooperative, appears stated age, no distress, and mildly obese Head: Normocephalic,  without obvious abnormality, atraumatic Eyes: negative findings: lids and lashes normal, conjunctivae and sclerae normal, corneas clear, and pupils equal, round, reactive to light and accomodation Ears: normal TM's and external ear canals both ears Nose: Nares normal. Septum midline. Mucosa normal. No drainage or sinus tenderness. Throat:  Several missing teeth in the front.  Oropharynx without masses or edema Neck: no adenopathy, supple, symmetrical, trachea midline, thyroid not enlarged, symmetric, no tenderness/mass/nodules, and transmitted cardiac sounds to the carotids bilaterally Back: symmetric, no curvature. ROM normal. No CVA tenderness. Lungs: clear to auscultation bilaterally Chest wall: no tenderness Heart:  Regular rate and rhythm.  S1-S2 heard.  2 out of 6 systolic ejection murmur noted that radiates to carotids Abdomen: soft, non-tender; bowel sounds normal; no masses,  no organomegaly Extremities: extremities normal, atraumatic, no cyanosis or edema  Pulses: 2+ and symmetric Skin: Skin color, texture, turgor normal. No rashes or lesions Lymph nodes: Cervical, supraclavicular, and axillary nodes normal. Neurologic: Grossly normal Psych: Affect somewhat flat but very pleasant, interactive  REFUSED PHQ9  Assessment/ Plan: [name] here for annual physical exam.   Annual physical exam  Stress due to illness of family member  Type 2 diabetes mellitus with other specified complication, without long-term current use of insulin (HCC) - Plan: CMP14+EGFR, Bayer DCA Hb A1c Waived, CBC, metFORMIN (GLUCOPHAGE-XR) 500 MG 24 hr tablet  Long term (current) use of oral hypoglycemic drugs  Hyperlipidemia due to type 2 diabetes mellitus (HCC) - Plan: Lipid Panel, TSH, CMP14+EGFR  Hypertension associated with diabetes (HCC) - Plan: CMP14+EGFR  Screening for malignant neoplasm of prostate - Plan: PSA  Needs colonoscopy but is not prioritizing these types of screening test right  now.  His primary concern at this time is the health of his wife, who is very ill  His sugar is borderline but has improved with the increased dose of metformin.  He is amenable to advancing to 1000 mg extended release metformin twice daily and has some leftover metformin at home that he will utilize.  Check renal function, CBC given use of metformin.  We will get him on our list for diabetic eye exam here in the office  Lipid panel, TSH and CMP collected.  Continue statin, blood pressure regimen his blood pressure is controlled upon recheck  Asymptomatic from prostate standpoint.  Screening PSA collected  Counseled on healthy lifestyle choices, including diet (rich in fruits, vegetables and lean meats and low in salt and simple carbohydrates) and exercise (at least 30 minutes of moderate physical activity daily).  Patient to follow up in 63m for DM  Jadie Allington M. Nadine Counts, DO

## 2023-05-21 NOTE — Patient Instructions (Signed)
Go back to the old metformin er 500. Take 2 tablets twice daily.  We will call you to set up a diabetic eye exam in the next month or so

## 2023-05-22 LAB — CMP14+EGFR
ALT: 49 IU/L — ABNORMAL HIGH (ref 0–44)
AST: 34 IU/L (ref 0–40)
Albumin/Globulin Ratio: 1.5
Albumin: 4.3 g/dL (ref 3.8–4.8)
Alkaline Phosphatase: 88 IU/L (ref 44–121)
BUN/Creatinine Ratio: 15 (ref 10–24)
BUN: 14 mg/dL (ref 8–27)
Bilirubin Total: 0.6 mg/dL (ref 0.0–1.2)
CO2: 24 mmol/L (ref 20–29)
Calcium: 9.5 mg/dL (ref 8.6–10.2)
Chloride: 100 mmol/L (ref 96–106)
Creatinine, Ser: 0.95 mg/dL (ref 0.76–1.27)
Globulin, Total: 2.8 g/dL (ref 1.5–4.5)
Glucose: 145 mg/dL — ABNORMAL HIGH (ref 70–99)
Potassium: 5 mmol/L (ref 3.5–5.2)
Sodium: 137 mmol/L (ref 134–144)
Total Protein: 7.1 g/dL (ref 6.0–8.5)
eGFR: 85 mL/min/{1.73_m2} (ref >59)

## 2023-05-22 LAB — CBC
Hematocrit: 46.7 % (ref 37.5–51.0)
MCH: 31.3 pg (ref 26.6–33.0)
MCHC: 35.1 g/dL (ref 31.5–35.7)
Platelets: 304 10*3/uL (ref 150–450)
RBC: 5.24 x10E6/uL (ref 4.14–5.80)
RDW: 11.9 % (ref 11.6–15.4)

## 2023-05-22 LAB — LIPID PANEL
Chol/HDL Ratio: 3.1 ratio (ref 0.0–5.0)
Cholesterol, Total: 90 mg/dL — ABNORMAL LOW (ref 100–199)
HDL: 29 mg/dL — ABNORMAL LOW (ref >39)
LDL Chol Calc (NIH): 46 mg/dL (ref 0–99)
Triglycerides: 70 mg/dL (ref 0–149)
VLDL Cholesterol Cal: 15 mg/dL (ref 5–40)

## 2023-05-22 LAB — TSH: TSH: 1.42 u[IU]/mL (ref 0.450–4.500)

## 2023-05-22 LAB — PSA: Prostate Specific Ag, Serum: 0.5 ng/mL (ref 0.0–4.0)

## 2023-05-29 ENCOUNTER — Telehealth: Payer: Self-pay | Admitting: Family Medicine

## 2023-05-29 NOTE — Telephone Encounter (Signed)
Left message for patient to call back to schedule diabetic retinal screening with new camera we have in office now.

## 2023-05-30 ENCOUNTER — Ambulatory Visit: Payer: Medicare Other

## 2023-06-21 ENCOUNTER — Ambulatory Visit: Payer: Medicare Other

## 2023-06-21 VITALS — Ht 68.0 in | Wt 200.0 lb

## 2023-06-21 DIAGNOSIS — Z0000 Encounter for general adult medical examination without abnormal findings: Secondary | ICD-10-CM

## 2023-06-21 NOTE — Progress Notes (Signed)
Subjective:   Gary Lopez is a 72 y.o. male who presents for Medicare Annual/Subsequent preventive examination.  Visit Complete: Virtual  I connected with  Gary Lopez on 06/21/23 by a audio enabled telemedicine application and verified that I am speaking with the correct person using two identifiers.  Patient Location: Home  Provider Location: Home Office  I discussed the limitations of evaluation and management by telemedicine. The patient expressed understanding and agreed to proceed.  Patient Medicare AWV questionnaire was completed by the patient on 06/21/2023; I have confirmed that all information answered by patient is correct and no changes since this date.  Review of Systems    Per patient no change in vitals since last visit, unable to obtain new vitals due to telehealth visit  Cardiac Risk Factors include: advanced age (>19men, >78 women);diabetes mellitus;dyslipidemia;male gender;hypertension Nutrition Risk Assessment:  Has the patient had any N/V/D within the last 2 months?  No  Does the patient have any non-healing wounds?  No  Has the patient had any unintentional weight loss or weight gain?  No   Diabetes:  Is the patient diabetic?  Yes  If diabetic, was a CBG obtained today?  No  Did the patient bring in their glucometer from home?  No  How often do you monitor your CBG's? Daily .   Financial Strains and Diabetes Management:  Are you having any financial strains with the device, your supplies or your medication? No .  Does the patient want to be seen by Chronic Care Management for management of their diabetes?  No  Would the patient like to be referred to a Nutritionist or for Diabetic Management?  No   Diabetic Exams: Diabetic Eye Exam: Completed 12/2022 Diabetic Foot Exam: Overdue, Pt has been advised about the importance in completing this exam. Pt is scheduled for diabetic foot exam on next office .     Objective:    Today's Vitals   06/21/23  1529  Weight: 200 lb (90.7 kg)  Height: 5\' 8"  (1.727 m)   Body mass index is 30.41 kg/m.     06/21/2023    3:34 PM 06/19/2022    9:09 AM 06/16/2021   10:19 AM 08/27/2019    6:00 PM 08/26/2019    8:14 AM 06/24/2019    2:26 PM  Advanced Directives  Does Patient Have a Medical Advance Directive? Yes No No No No No  Type of Estate agent of [place];Living will       Copy of Healthcare Power of Attorney in Chart? No - copy requested       Would patient like information on creating a medical advance directive?  No - Patient declined No - Patient declined No - Patient declined No - Patient declined No - Patient declined    Current Medications (verified) Outpatient Encounter Medications as of 06/21/2023  Medication Sig   ACCU-CHEK GUIDE test strip USE 1 STRIP TO CHECK GLUCOSE UP TO TWICE DAILY   Accu-Chek Softclix Lancets lancets USE 1  TO CHECK GLUCOSE UP TO TWICE DAILY   amLODipine (NORVASC) 10 MG tablet Take 1 tablet (10 mg total) by mouth daily.   atorvastatin (LIPITOR) 40 MG tablet Take 1 tablet (40 mg total) by mouth daily.   blood glucose meter kit and supplies Dispense based on patient and insurance preference. Use up to 2 times daily as directed. (FOR ICD-10 E11.65   gabapentin (NEURONTIN) 300 MG capsule Take 1 capsule (300 mg total) by mouth  3 (three) times daily as needed.   losartan (COZAAR) 50 MG tablet Take 1 tablet (50 mg total) by mouth daily.   metFORMIN (GLUCOPHAGE-XR) 500 MG 24 hr tablet Take 2 tablets (1,000 mg total) by mouth 2 (two) times daily with a meal.   No facility-administered encounter medications on file as of 06/21/2023.    Allergies (verified) Patient has no known allergies.   History: Past Medical History:  Diagnosis Date   Abnormal EKG    left bundle branch blcok ekg 06-24-2019 saw dr Diona Browner cardiology for   Arthritis    oa   Heart murmur    History of pneumonia    20 years ago   Hypertension    Type 2 diabetes mellitus (HCC)     Diet controlled   Past Surgical History:  Procedure Laterality Date   right wrist surgery  yrs ago   TOTAL HIP ARTHROPLASTY 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   Procedure: TOTAL HIP ARTHROPLASTY ANTERIOR APPROACH;  Surgeon: Samson Frederic, MD;  Location: [facility];  Service: Orthopedics;  Laterality: Left;   Family History  Problem Relation Age of Onset   Heart disease Mother    COPD Father    Social History   Socioeconomic History   Marital status: Married    Spouse name: Burna Mortimer   Number of children: 2   Years of education: Not on file   Highest education level: Not on file  Occupational History   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 They have 2 children - one 2 hours away, one in [place].   Wife is currently sick with cancer   Social Determinants of Health   Financial Resource Strain: Low Risk  (06/21/2023)   Overall Financial Resource Strain (CARDIA)    Difficulty of Paying Living Expenses: Not hard at all  Food Insecurity: No Food Insecurity (06/21/2023)   Hunger Vital Sign    Worried About Running Out of Food in the Last Year: Never true    Ran Out of Food in the Last Year: Never true  Transportation Needs: No Transportation Needs (06/21/2023)   PRAPARE - Administrator, Civil Service (Medical): No    Lack of Transportation (Non-Medical): No  Physical Activity: Sufficiently Active (06/21/2023)   Exercise Vital Sign    Days of Exercise per Week: 5 days    Minutes of Exercise per Session: 60 min  Stress: No Stress Concern Present (06/21/2023)   [employer] of Occupational Health - Occupational Stress Questionnaire    Feeling of Stress : Not at all   Social Connections: Moderately Isolated (06/21/2023)   Social Connection and Isolation Panel [NHANES]    Frequency of Communication with Friends and Family: More than three times a week    Frequency of Social Gatherings with Friends and Family: More than three times a week    Attends Religious Services: Never    Database administrator or Organizations: No    Attends Banker Meetings: Never    Marital Status: Married    Tobacco Counseling Counseling given: Not Answered   Clinical Intake:  Pre-visit preparation completed: Yes  Pain : No/denies pain     Nutritional Risks: None Diabetes: Yes CBG done?: No Did pt. bring in CBG monitor from home?: No  How often do you need to have someone help you when you read instructions, pamphlets, or other written materials from your doctor or pharmacy?: 1 - Never  Interpreter Needed?: No  Information entered by :: Renie Ora, LPN   Activities of Daily Living    06/21/2023    3:35 PM  In your present state of health, do you have any difficulty performing the following activities:  Hearing? 0  Vision? 0  Difficulty concentrating or making decisions? 0  Walking or climbing stairs? 0  Dressing or bathing? 0  Doing errands, shopping? 0  Preparing Food and eating ? N  Using the Toilet? N  In the past six months, have you accidently leaked urine? N  Do you have problems with loss of bowel control? N  Managing your Medications? N  Managing your Finances? N  Housekeeping or managing your Housekeeping? N    Patient Care Team: Raliegh Ip, DO as PCP - General (Family Medicine) Samson Frederic, MD as Consulting Physician (Orthopedic Surgery)  Indicate any recent Medical Services you may have received from other than [facility] providers in the past year (date may be approximate).     Assessment:   This is a routine wellness examination for Mahlon.  Hearing/Vision screen Vision Screening - Comments:: Wears rx  glasses - up to date with routine eye exams with  Dr.Lee   Dietary issues and exercise activities discussed:     Goals Addressed             This Visit's Progress    Exercise 3x per week (30 min per time)         Depression Screen    06/21/2023    3:33 PM 02/13/2023    8:12 AM 11/12/2022    8:47 AM 06/19/2022    9:07 AM 05/11/2022    8:08 AM 01/11/2022    9:30 AM 11/21/2021    8:31 AM  PHQ 2/9 Scores  PHQ - 2 Score 0 0 0 0 0 0 0  PHQ- 9 Score  0    0 0    Fall Risk    06/21/2023    3:31 PM 05/21/2023    9:24 AM 02/13/2023    8:12 AM 11/12/2022    8:47 AM 08/27/2022    9:23 AM  Fall Risk   Falls in the past year? 0 0 0 0 0  Number falls in past yr: 0 0 0    Injury with Fall? 0 0 0    Risk for fall due to : No Fall Risks No Fall Risks No Fall Risks    Follow up Falls prevention discussed Education provided Education provided      MEDICARE RISK AT HOME:  Medicare Risk at Home - 06/21/23 1531     Any stairs in or around the home? Yes    If so, are there any without handrails? No    Home free of loose throw rugs in walkways, pet beds, electrical cords, etc? Yes    Adequate lighting in your home to reduce risk of falls? Yes    Life alert? No    Use of a cane, walker or w/c? No    Grab bars in the bathroom? Yes    Shower chair or bench in shower? Yes    Elevated toilet seat or a handicapped  toilet? Yes             TIMED UP AND GO:  Was the test performed?  No    Cognitive Function:        06/21/2023    3:35 PM 06/19/2022    9:10 AM 06/16/2021   10:19 AM  6CIT Screen  What Year? 0 points 0 points 0 points  What month? 0 points 0 points 0 points  What time? 0 points 0 points 0 points  Count back from 20 0 points 0 points 0 points  Months in reverse 0 points 4 points 0 points  Repeat phrase 0 points 10 points 0 points  Total Score 0 points 14 points 0 points    Immunizations Immunization History  Administered Date(s) Administered   Fluad Quad(high Dose  65+) 10/24/2019, 10/11/2021, 11/12/2022   Moderna Sars-Covid-2 Vaccination 03/17/2020, 04/14/2020   Pneumococcal Conjugate-13 11/24/2018   Pneumococcal Polysaccharide-23 02/17/2021   Tdap 03/22/2006, 11/24/2018    TDAP status: Up to date  Flu Vaccine status: Up to date  Pneumococcal vaccine status: Up to date  Covid-19 vaccine status: Completed vaccines  Qualifies for Shingles Vaccine? Yes   Zostavax completed No   Shingrix Completed?: No.    Education has been provided regarding the importance of this vaccine. Patient has been advised to call insurance company to determine out of pocket expense if they have not yet received this vaccine. Advised may also receive vaccine at local pharmacy or Health Dept. Verbalized acceptance and understanding.  Screening Tests Health Maintenance  Topic Date Due   Zoster Vaccines- Shingrix (1 of 2) Never done   OPHTHALMOLOGY EXAM  07/19/2022   COVID-19 Vaccine (3 - 2023-24 season) 08/10/2022   INFLUENZA VACCINE  07/11/2023   HEMOGLOBIN A1C  11/20/2023   Diabetic kidney evaluation - Urine ACR  02/13/2024   FOOT EXAM  02/13/2024   Diabetic kidney evaluation - eGFR measurement  05/20/2024   Medicare Annual Wellness (AWV)  06/20/2024   DTaP/Tdap/Td (3 - Td or Tdap) 11/24/2028   Colonoscopy  09/18/2032   Pneumonia Vaccine 74+ Years old  Completed   Hepatitis C Screening  Completed   HPV VACCINES  Aged Out    Health Maintenance  Health Maintenance Due  Topic Date Due   Zoster Vaccines- Shingrix (1 of 2) Never done   OPHTHALMOLOGY EXAM  07/19/2022   COVID-19 Vaccine (3 - 2023-24 season) 08/10/2022    Colorectal cancer screening: Type of screening: Cologuard. Completed 09/18/2022. Repeat every 3 years  Lung Cancer Screening: (Low Dose CT Chest recommended if Age 40-80 years, 20 pack-year currently smoking OR have quit w/in 15years.) does not qualify.   Lung Cancer Screening Referral: n/a  Additional Screening:  Hepatitis C Screening:  does not qualify; Completed 02/22/2020  Vision Screening: Recommended annual ophthalmology exams for early detection of glaucoma and other disorders of the eye. Is the patient up to date with their annual eye exam?  Yes  Who is the provider or what is the name of the office in which the patient attends annual eye exams? Dr.Lee  If pt is not established with a provider, would they like to be referred to a provider to establish care? No .   Dental Screening: Recommended annual dental exams for proper oral hygiene    Community Resource Referral / Chronic Care Management: CRR required this visit?  No   CCM required this visit?  No     Plan:     I have personally reviewed  and noted the following in the patient's chart:   Medical and social history Use of alcohol, tobacco or illicit drugs  Current medications and supplements including opioid prescriptions. Patient is not currently taking opioid prescriptions. Functional ability and status Nutritional status Physical activity Advanced directives List of other physicians Hospitalizations, surgeries, and ER visits in previous 12 months Vitals Screenings to include cognitive, depression, and falls Referrals and appointments  In addition, I have reviewed and discussed with patient certain preventive protocols, quality metrics, and best practice recommendations. A written personalized care plan for preventive services as well as general preventive health recommendations were provided to patient.     Lorrene Reid, LPN   [id number]   After Visit Summary: (MyChart) Due to this being a telephonic visit, the after visit summary with patients personalized plan was offered to patient via MyChart   Nurse Notes: none

## 2023-06-21 NOTE — Patient Instructions (Signed)
Gary Lopez , Thank you for taking time to come for your Medicare Wellness Visit. I appreciate your ongoing commitment to your health goals. Please review the following plan we discussed and let me know if I can assist you in the future.   These are the goals we discussed:  Goals      Exercise 3x per week (30 min per time)     Continue walking daily 06/19/22 - usually walking 5 miles 5-6 days per week on [facility] trail     Exercise 3x per week (30 min per time)        This is a list of the screening recommended for you and due dates:  Health Maintenance  Topic Date Due   Zoster (Shingles) Vaccine (1 of 2) Never done   Eye exam for diabetics  07/19/2022   COVID-19 Vaccine (3 - 2023-24 season) 08/10/2022   Flu Shot  07/11/2023   Hemoglobin A1C  11/20/2023   Yearly kidney health urinalysis for diabetes  02/13/2024   Complete foot exam   02/13/2024   Yearly kidney function blood test for diabetes  05/20/2024   Medicare Annual Wellness Visit  06/20/2024   DTaP/Tdap/Td vaccine (3 - Td or Tdap) 11/24/2028   Colon Cancer Screening  09/18/2032   Pneumonia Vaccine  Completed   Hepatitis C Screening  Completed   HPV Vaccine  Aged Out    Advanced directives: Advance directive discussed with you today. I have provided a copy for you to complete at home and have notarized. Once this is complete please bring a copy in to our office so we can scan it into your chart. Information on Advanced Care Planning can be found at [facility] [place] Advance Health Care Directives Advance Health Care Directives ([email or web address]    Conditions/risks identified: Aim for 30 minutes of exercise or brisk walking, 6-8 glasses of water, and 5 servings of fruits and vegetables each day.   Next appointment: Follow up in one year for your annual wellness visit.   Preventive Care 73 Years and Older, Male  Preventive care refers to lifestyle choices and visits with your health care provider that can  promote health and wellness. What does preventive care include? A yearly physical exam. This is also called an annual well check. Dental exams once or twice a year. Routine eye exams. Ask your health care provider how often you should have your eyes checked. Personal lifestyle choices, including: Daily care of your teeth and gums. Regular physical activity. Eating a healthy diet. Avoiding tobacco and drug use. Limiting alcohol use. Practicing safe sex. Taking low doses of aspirin every day. Taking vitamin and mineral supplements as recommended by your health care provider. What happens during an annual well check? The services and screenings done by your health care provider during your annual well check will depend on your age, overall health, lifestyle risk factors, and family history of disease. Counseling  Your health care provider may ask you questions about your: Alcohol use. Tobacco use. Drug use. Emotional well-being. Home and relationship well-being. Sexual activity. Eating habits. History of falls. Memory and ability to understand (cognition). Work and work Astronomer. Screening  You may have the following tests or measurements: Height, weight, and BMI. Blood pressure. Lipid and cholesterol levels. These may be checked every 5 years, or more frequently if you are over 41 years old. Skin check. Lung cancer screening. You may have this screening every year starting at age 61 if you have  a 30-pack-year history of smoking and currently smoke or have quit within the past 15 years. Fecal occult blood test (FOBT) of the stool. You may have this test every year starting at age 93. Flexible sigmoidoscopy or colonoscopy. You may have a sigmoidoscopy every 5 years or a colonoscopy every 10 years starting at age 58. Prostate cancer screening. Recommendations will vary depending on your family history and other risks. Hepatitis C blood test. Hepatitis B blood test. Sexually  transmitted disease (STD) testing. Diabetes screening. This is done by checking your blood sugar (glucose) after you have not eaten for a while (fasting). You may have this done every 1-3 years. Abdominal aortic aneurysm (AAA) screening. You may need this if you are a current or former smoker. Osteoporosis. You may be screened starting at age 43 if you are at high risk. Talk with your health care provider about your test results, treatment options, and if necessary, the need for more tests. Vaccines  Your health care provider may recommend certain vaccines, such as: Influenza vaccine. This is recommended every year. Tetanus, diphtheria, and acellular pertussis (Tdap, Td) vaccine. You may need a Td booster every 10 years. Zoster vaccine. You may need this after age 83. Pneumococcal 13-valent conjugate (PCV13) vaccine. One dose is recommended after age 39. Pneumococcal polysaccharide (PPSV23) vaccine. One dose is recommended after age 41. Talk to your health care provider about which screenings and vaccines you need and how often you need them. This information is not intended to replace advice given to you by your health care provider. Make sure you discuss any questions you have with your health care provider. Document Released: 12/23/2015 Document Revised: 08/15/2016 Document Reviewed: 09/27/2015 Elsevier Interactive Patient Education  2017 [employer].  Fall Prevention in the Home Falls can cause injuries. They can happen to people of all ages. There are many things you can do to make your home safe and to help prevent falls. What can I do on the outside of my home? Regularly fix the edges of walkways and driveways and fix any cracks. Remove anything that might make you trip as you walk through a door, such as a raised step or threshold. Trim any bushes or trees on the path to your home. Use bright outdoor lighting. Clear any walking paths of anything that might make someone trip, such as  rocks or tools. Regularly check to see if handrails are loose or broken. Make sure that both sides of any steps have handrails. Any raised decks and porches should have guardrails on the edges. Have any leaves, snow, or ice cleared regularly. Use sand or salt on walking paths during winter. Clean up any spills in your garage right away. This includes oil or grease spills. What can I do in the bathroom? Use night lights. Install grab bars by the toilet and in the tub and shower. Do not use towel bars as grab bars. Use non-skid mats or decals in the tub or shower. If you need to sit down in the shower, use a plastic, non-slip stool. Keep the floor dry. Clean up any water that spills on the floor as soon as it happens. Remove soap buildup in the tub or shower regularly. Attach bath mats securely with double-sided non-slip rug tape. Do not have throw rugs and other things on the floor that can make you trip. What can I do in the bedroom? Use night lights. Make sure that you have a light by your bed that is easy to  reach. Do not use any sheets or blankets that are too big for your bed. They should not hang down onto the floor. Have a firm chair that has side arms. You can use this for support while you get dressed. Do not have throw rugs and other things on the floor that can make you trip. What can I do in the kitchen? Clean up any spills right away. Avoid walking on wet floors. Keep items that you use a lot in easy-to-reach places. If you need to reach something above you, use a strong step stool that has a grab bar. Keep electrical cords out of the way. Do not use floor polish or wax that makes floors slippery. If you must use wax, use non-skid floor wax. Do not have throw rugs and other things on the floor that can make you trip. What can I do with my stairs? Do not leave any items on the stairs. Make sure that there are handrails on both sides of the stairs and use them. Fix handrails  that are broken or loose. Make sure that handrails are as long as the stairways. Check any carpeting to make sure that it is firmly attached to the stairs. Fix any carpet that is loose or worn. Avoid having throw rugs at the top or bottom of the stairs. If you do have throw rugs, attach them to the floor with carpet tape. Make sure that you have a light switch at the top of the stairs and the bottom of the stairs. If you do not have them, ask someone to add them for you. What else can I do to help prevent falls? Wear shoes that: Do not have high heels. Have rubber bottoms. Are comfortable and fit you well. Are closed at the toe. Do not wear sandals. If you use a stepladder: Make sure that it is fully opened. Do not climb a closed stepladder. Make sure that both sides of the stepladder are locked into place. Ask someone to hold it for you, if possible. Clearly mark and make sure that you can see: Any grab bars or handrails. First and last steps. Where the edge of each step is. Use tools that help you move around (mobility aids) if they are needed. These include: Canes. Walkers. Scooters. Crutches. Turn on the lights when you go into a dark area. Replace any light bulbs as soon as they burn out. Set up your furniture so you have a clear path. Avoid moving your furniture around. If any of your floors are uneven, fix them. If there are any pets around you, be aware of where they are. Review your medicines with your doctor. Some medicines can make you feel dizzy. This can increase your chance of falling. Ask your doctor what other things that you can do to help prevent falls. This information is not intended to replace advice given to you by your health care provider. Make sure you discuss any questions you have with your health care provider. Document Released: 09/22/2009 Document Revised: 05/03/2016 Document Reviewed: 12/31/2014 Elsevier Interactive Patient Education  2017 [employer].

## 2023-08-03 ENCOUNTER — Other Ambulatory Visit: Payer: Self-pay | Admitting: Family Medicine

## 2023-08-03 DIAGNOSIS — E1165 Type 2 diabetes mellitus with hyperglycemia: Secondary | ICD-10-CM

## 2023-08-28 ENCOUNTER — Telehealth: Payer: Self-pay | Admitting: Family Medicine

## 2023-09-23 ENCOUNTER — Ambulatory Visit: Payer: Medicare Other | Admitting: Family Medicine

## 2023-09-23 ENCOUNTER — Encounter: Payer: Self-pay | Admitting: Family Medicine

## 2023-09-23 VITALS — BP 128/71 | HR 82 | Temp 98.5°F | Ht 68.0 in | Wt 204.0 lb

## 2023-09-23 DIAGNOSIS — I152 Hypertension secondary to endocrine disorders: Secondary | ICD-10-CM | POA: Diagnosis not present

## 2023-09-23 DIAGNOSIS — Z23 Encounter for immunization: Secondary | ICD-10-CM | POA: Diagnosis not present

## 2023-09-23 DIAGNOSIS — E1169 Type 2 diabetes mellitus with other specified complication: Secondary | ICD-10-CM | POA: Diagnosis not present

## 2023-09-23 DIAGNOSIS — E119 Type 2 diabetes mellitus without complications: Secondary | ICD-10-CM | POA: Diagnosis not present

## 2023-09-23 DIAGNOSIS — E1159 Type 2 diabetes mellitus with other circulatory complications: Secondary | ICD-10-CM | POA: Diagnosis not present

## 2023-09-23 DIAGNOSIS — E785 Hyperlipidemia, unspecified: Secondary | ICD-10-CM

## 2023-09-23 DIAGNOSIS — Z7984 Long term (current) use of oral hypoglycemic drugs: Secondary | ICD-10-CM

## 2023-09-23 LAB — BAYER DCA HB A1C WAIVED: HB A1C (BAYER DCA - WAIVED): 6.9 % — ABNORMAL HIGH (ref 4.8–5.6)

## 2023-09-23 MED ORDER — ATORVASTATIN CALCIUM 40 MG PO TABS
40.0000 mg | ORAL_TABLET | Freq: Every day | ORAL | 3 refills | Status: DC
Start: 2023-09-23 — End: 2024-05-19

## 2023-09-23 MED ORDER — AMLODIPINE BESYLATE 10 MG PO TABS
10.0000 mg | ORAL_TABLET | Freq: Every day | ORAL | 3 refills | Status: DC
Start: 2023-09-23 — End: 2024-05-19

## 2023-09-23 MED ORDER — METFORMIN HCL ER 500 MG PO TB24: 1000.0000 mg | ORAL_TABLET | Freq: Two times a day (BID) | ORAL | 3 refills | Status: DC

## 2023-09-23 MED ORDER — LOSARTAN POTASSIUM 50 MG PO TABS: 50.0000 mg | ORAL_TABLET | Freq: Every day | ORAL | 3 refills | Status: DC

## 2023-09-23 NOTE — Progress Notes (Signed)
Subjective: CC:DM PCP: Raliegh Ip, DO ZOX:WRUEA R Palladino is a 72 y.o. male presenting to clinic today for:  1. Type 2 Diabetes with hypertension, hyperlipidemia:  Patient reports compliance with all medications.  He reports his blood sugars have been a little bit higher than average but admits that [place] the [place] a [place] and does eat junk food there.  He continues to walk several miles per day.  No hypoglycemic episodes.  Diabetes Health Maintenance Due  Topic Date Due   OPHTHALMOLOGY EXAM  07/19/2022   HEMOGLOBIN A1C  11/20/2023   FOOT EXAM  02/13/2024    Last A1c:  Lab Results  Component Value Date   HGBA1C 7.0 (H) 05/21/2023    ROS: Denies dizziness, LOC, polyuria, polydipsia, unintended weight loss/gain, foot ulcerations, numbness or tingling in extremities, shortness of breath or chest pain.   ROS: Per HPI  No Known Allergies Past Medical History:  Diagnosis Date   Abnormal EKG    left bundle branch blcok ekg 06-24-2019 saw dr Diona Browner cardiology for   Arthritis    oa   Heart murmur    History of pneumonia    20 years ago   Hypertension    Type 2 diabetes mellitus (HCC)    Diet controlled    Current Outpatient Medications:    Accu-Chek Softclix Lancets lancets, Test BS up to twice daily Dx E11.65, Disp: 200 each, Rfl: 3   amLODipine (NORVASC) 10 MG tablet, Take 1 tablet (10 mg total) by mouth daily., Disp: 90 tablet, Rfl: 3   atorvastatin (LIPITOR) 40 MG tablet, Take 1 tablet (40 mg total) by mouth daily., Disp: 90 tablet, Rfl: 3   blood glucose meter kit and supplies, Dispense based on patient and insurance preference. Use up to 2 times daily as directed. (FOR ICD-10 E11.65, Disp: 1 each, Rfl: 0   gabapentin (NEURONTIN) 300 MG capsule, Take 1 capsule (300 mg total) by mouth 3 (three) times daily as needed., Disp: 90 capsule, Rfl: 2   glucose blood (ACCU-CHEK GUIDE) test strip, Test BS up to twice daily Dx E11.65, Disp: 200 each, Rfl:  3   losartan (COZAAR) 50 MG tablet, Take 1 tablet (50 mg total) by mouth daily., Disp: 90 tablet, Rfl: 3   metFORMIN (GLUCOPHAGE-XR) 500 MG 24 hr tablet, Take 2 tablets (1,000 mg total) by mouth 2 (two) times daily with a meal., Disp: 360 tablet, Rfl: 3 Social History   Socioeconomic History   Marital status: Married    Spouse name: Burna Mortimer   Number of children: 2   Years of education: Not on file   Highest education level: Not on file  Occupational History   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 They have 2 children - one 2 hours away, one in [place].   Wife is currently sick with cancer   Social Determinants of Health   Financial Resource Strain: Low Risk  (06/21/2023)   Overall Financial Resource Strain (CARDIA)    Difficulty of Paying Living Expenses: Not hard at all  Food Insecurity: No Food Insecurity (06/21/2023)   Hunger  Vital Sign    Worried About Programme researcher, broadcasting/film/video in the Last Year: Never true    Ran Out of Food in the Last Year: Never true  Transportation Needs: No Transportation Needs (06/21/2023)   PRAPARE - Administrator, Civil Service (Medical): No    Lack of Transportation (Non-Medical): No  Physical Activity: Sufficiently Active (06/21/2023)   Exercise Vital Sign    Days of Exercise per Week: 5 days    Minutes of Exercise per Session: 60 min  Stress: No Stress Concern Present (06/21/2023)   [employer] of Occupational Health - Occupational Stress Questionnaire    Feeling of Stress : Not at all  Social Connections: Moderately Isolated (06/21/2023)   Social Connection and Isolation Panel [NHANES]    Frequency of Communication  with Friends and Family: More than three times a week    Frequency of Social Gatherings with Friends and Family: More than three times a week    Attends Religious Services: Never    Database administrator or Organizations: No    Attends Banker Meetings: Never    Marital Status: Married  Catering manager Violence: Not At Risk (06/21/2023)   Humiliation, Afraid, Rape, and Kick questionnaire    Fear of Current or Ex-Partner: No    Emotionally Abused: No    Physically Abused: No    Sexually Abused: No   Family History  Problem Relation Age of Onset   Heart disease Mother    COPD Father     Objective: Office vital signs reviewed. BP 128/71   Pulse 82   Temp 98.5 F (36.9 C)   Ht 5\' 8"  (1.727 m)   Wt 204 lb (92.5 kg)   SpO2 96%   BMI 31.02 kg/m   Physical Examination:  General: Awake, alert, well nourished, No acute distress HEENT sclera white, MMM Cardio: regular rate and rhythm, S1S2 heard, soft systolic murmur appreciated at RSB. Does NOT radiate. Pulm: clear to auscultation bilaterally, no wheezes, rhonchi or rales; normal work of breathing on room air Extremities: warm, well perfused, No edema, cyanosis or clubbing; +2 pulses bilaterally  Assessment/ Plan: 72 y.o. male   Diabetes mellitus treated with oral medication (HCC) - Plan: Bayer DCA Hb A1c Waived, metFORMIN (GLUCOPHAGE-XR) 500 MG 24 hr tablet  Hyperlipidemia due to type 2 diabetes mellitus (HCC) - Plan: atorvastatin (LIPITOR) 40 MG tablet  Hypertension associated with diabetes (HCC) - Plan: amLODipine (NORVASC) 10 MG tablet, losartan (COZAAR) 50 MG tablet  Encounter for immunization - Plan: Flu Vaccine Trivalent High Dose (Fluad)  Sugar controlled but borderline with A1c at 6.9.  This is down from previous.  I cautioned him to continue watching diet as I do not want him to have to be on any extra medications.  Continue physical exercise.  Has been set up for retinal exam here in office.  Up-to-date  on urine and foot exam.  Not yet due for fasting lipid.  Continue statin  Blood pressure is controlled upon recheck.  No changes needed  Influenza vaccination administered  Raliegh Ip, DO [facility] [phone]

## 2023-09-25 ENCOUNTER — Ambulatory Visit: Payer: Medicare Other

## 2023-09-25 LAB — HM DIABETES EYE EXAM

## 2023-11-09 DIAGNOSIS — R051 Acute cough: Secondary | ICD-10-CM | POA: Diagnosis not present

## 2023-11-09 DIAGNOSIS — R070 Pain in throat: Secondary | ICD-10-CM | POA: Diagnosis not present

## 2023-11-09 DIAGNOSIS — B349 Viral infection, unspecified: Secondary | ICD-10-CM | POA: Diagnosis not present

## 2023-11-09 DIAGNOSIS — Z20822 Contact with and (suspected) exposure to covid-19: Secondary | ICD-10-CM | POA: Diagnosis not present

## 2023-11-14 ENCOUNTER — Other Ambulatory Visit: Payer: Self-pay

## 2023-11-14 ENCOUNTER — Encounter: Payer: Self-pay | Admitting: Nurse Practitioner

## 2023-11-14 ENCOUNTER — Encounter (HOSPITAL_COMMUNITY): Payer: Self-pay

## 2023-11-14 ENCOUNTER — Emergency Department (HOSPITAL_COMMUNITY): Payer: Medicare Other

## 2023-11-14 ENCOUNTER — Ambulatory Visit (INDEPENDENT_AMBULATORY_CARE_PROVIDER_SITE_OTHER): Payer: Medicare Other

## 2023-11-14 ENCOUNTER — Encounter (HOSPITAL_COMMUNITY)
Admission: EM | Disposition: A | Discharge status: Home / Self Care | Payer: Self-pay | Source: Home / Self Care | Attending: Emergency Medicine

## 2023-11-14 ENCOUNTER — Ambulatory Visit: Payer: Medicare Other | Admitting: Nurse Practitioner

## 2023-11-14 ENCOUNTER — Observation Stay (HOSPITAL_COMMUNITY)
Admission: EM | Admit: 2023-11-14 | Discharge: 2023-11-15 | Disposition: A | Discharge status: Home / Self Care | Payer: Medicare Other | Attending: Cardiovascular Disease | Admitting: Cardiovascular Disease

## 2023-11-14 VITALS — BP 171/64 | HR 37 | Temp 97.7°F | Ht 68.0 in | Wt 219.0 lb

## 2023-11-14 DIAGNOSIS — R0902 Hypoxemia: Secondary | ICD-10-CM | POA: Diagnosis not present

## 2023-11-14 DIAGNOSIS — J90 Pleural effusion, not elsewhere classified: Secondary | ICD-10-CM | POA: Diagnosis not present

## 2023-11-14 DIAGNOSIS — R0689 Other abnormalities of breathing: Secondary | ICD-10-CM | POA: Diagnosis not present

## 2023-11-14 DIAGNOSIS — E119 Type 2 diabetes mellitus without complications: Secondary | ICD-10-CM | POA: Insufficient documentation

## 2023-11-14 DIAGNOSIS — R6889 Other general symptoms and signs: Secondary | ICD-10-CM | POA: Diagnosis not present

## 2023-11-14 DIAGNOSIS — Z79899 Other long term (current) drug therapy: Secondary | ICD-10-CM | POA: Insufficient documentation

## 2023-11-14 DIAGNOSIS — R0989 Other specified symptoms and signs involving the circulatory and respiratory systems: Secondary | ICD-10-CM | POA: Diagnosis not present

## 2023-11-14 DIAGNOSIS — I509 Heart failure, unspecified: Secondary | ICD-10-CM | POA: Diagnosis not present

## 2023-11-14 DIAGNOSIS — Z7984 Long term (current) use of oral hypoglycemic drugs: Secondary | ICD-10-CM | POA: Diagnosis not present

## 2023-11-14 DIAGNOSIS — Z743 Need for continuous supervision: Secondary | ICD-10-CM | POA: Diagnosis not present

## 2023-11-14 DIAGNOSIS — I5031 Acute diastolic (congestive) heart failure: Secondary | ICD-10-CM | POA: Diagnosis not present

## 2023-11-14 DIAGNOSIS — R001 Bradycardia, unspecified: Secondary | ICD-10-CM | POA: Diagnosis not present

## 2023-11-14 DIAGNOSIS — I442 Atrioventricular block, complete: Secondary | ICD-10-CM

## 2023-11-14 DIAGNOSIS — J811 Chronic pulmonary edema: Secondary | ICD-10-CM | POA: Diagnosis not present

## 2023-11-14 DIAGNOSIS — Z96642 Presence of left artificial hip joint: Secondary | ICD-10-CM | POA: Diagnosis not present

## 2023-11-14 DIAGNOSIS — I10 Essential (primary) hypertension: Secondary | ICD-10-CM

## 2023-11-14 DIAGNOSIS — I499 Cardiac arrhythmia, unspecified: Secondary | ICD-10-CM | POA: Diagnosis not present

## 2023-11-14 DIAGNOSIS — I110 Hypertensive heart disease with heart failure: Secondary | ICD-10-CM | POA: Diagnosis not present

## 2023-11-14 DIAGNOSIS — R0602 Shortness of breath: Secondary | ICD-10-CM | POA: Diagnosis not present

## 2023-11-14 HISTORY — PX: PACEMAKER IMPLANT: EP1218

## 2023-11-14 LAB — COMPREHENSIVE METABOLIC PANEL
ALT: 74 U/L — ABNORMAL HIGH (ref 0–44)
AST: 42 U/L — ABNORMAL HIGH (ref 15–41)
Albumin: 3.2 g/dL — ABNORMAL LOW (ref 3.5–5.0)
Alkaline Phosphatase: 45 U/L (ref 38–126)
Anion gap: 7 (ref 5–15)
BUN: 23 mg/dL (ref 8–23)
CO2: 21 mmol/L — ABNORMAL LOW (ref 22–32)
Calcium: 8 mg/dL — ABNORMAL LOW (ref 8.9–10.3)
Chloride: 110 mmol/L (ref 98–111)
Creatinine, Ser: 1.16 mg/dL (ref 0.61–1.24)
GFR, Estimated: >60 mL/min (ref >60)
Glucose, Bld: 182 mg/dL — ABNORMAL HIGH (ref 70–99)
Potassium: 3.8 mmol/L (ref 3.5–5.1)
Sodium: 138 mmol/L (ref 135–145)
Total Bilirubin: 0.8 mg/dL (ref <1.2)
Total Protein: 5.8 g/dL — ABNORMAL LOW (ref 6.5–8.1)

## 2023-11-14 LAB — CBC WITH DIFFERENTIAL/PLATELET
Abs Immature Granulocytes: 0.06 10*3/uL (ref 0.00–0.07)
Basophils Absolute: 0.1 10*3/uL (ref 0.0–0.1)
Basophils Relative: 1 %
Eosinophils Absolute: 0.1 10*3/uL (ref 0.0–0.5)
Eosinophils Relative: 1 %
HCT: 39.2 % (ref 39.0–52.0)
Hemoglobin: 13.5 g/dL (ref 13.0–17.0)
Immature Granulocytes: 1 %
Lymphocytes Relative: 11 %
Lymphs Abs: 1 10*3/uL (ref 0.7–4.0)
MCH: 31.1 pg (ref 26.0–34.0)
MCHC: 34.4 g/dL (ref 30.0–36.0)
MCV: 90.3 fL (ref 80.0–100.0)
Monocytes Absolute: 0.6 10*3/uL (ref 0.1–1.0)
Monocytes Relative: 6 %
Neutro Abs: 8 10*3/uL — ABNORMAL HIGH (ref 1.7–7.7)
Neutrophils Relative %: 80 %
Platelets: 209 10*3/uL (ref 150–400)
RBC: 4.34 MIL/uL (ref 4.22–5.81)
RDW: 13.2 % (ref 11.5–15.5)
WBC: 9.7 10*3/uL (ref 4.0–10.5)
nRBC: 0 % (ref 0.0–0.2)

## 2023-11-14 LAB — GLUCOSE, CAPILLARY: Glucose-Capillary: 117 mg/dL — ABNORMAL HIGH (ref 70–99)

## 2023-11-14 LAB — ECHOCARDIOGRAM COMPLETE
AR max vel: 1.92 cm2
AV Area VTI: 1.76 cm2
AV Area mean vel: 1.94 cm2
AV Mean grad: 35.3 mm[Hg]
AV Peak grad: 57.5 mm[Hg]
Ao pk vel: 3.79 m/s
Height: 68 in
S' Lateral: 3.2 cm
Weight: 3504 [oz_av]

## 2023-11-14 LAB — CBG MONITORING, ED: Glucose-Capillary: 179 mg/dL — ABNORMAL HIGH (ref 70–99)

## 2023-11-14 LAB — MAGNESIUM: Magnesium: 1.9 mg/dL (ref 1.7–2.4)

## 2023-11-14 LAB — BRAIN NATRIURETIC PEPTIDE: B Natriuretic Peptide: 771.8 pg/mL — ABNORMAL HIGH (ref 0.0–100.0)

## 2023-11-14 LAB — TROPONIN I (HIGH SENSITIVITY)
Troponin I (High Sensitivity): 66 ng/L — ABNORMAL HIGH (ref <18)
Troponin I (High Sensitivity): 84 ng/L — ABNORMAL HIGH (ref <18)

## 2023-11-14 SURGERY — PACEMAKER IMPLANT
Anesthesia: LOCAL

## 2023-11-14 MED ORDER — IPRATROPIUM BROMIDE 0.02 % IN SOLN
0.5000 mg | Freq: Once | RESPIRATORY_TRACT | Status: DC
  Administered 2023-11-14: 0.5 mg via RESPIRATORY_TRACT

## 2023-11-14 MED ORDER — PERFLUTREN LIPID MICROSPHERE
1.0000 mL | INTRAVENOUS | Status: AC | PRN
  Administered 2023-11-14: 8 mL via INTRAVENOUS

## 2023-11-14 MED ORDER — ONDANSETRON HCL 4 MG/2ML IJ SOLN
4.0000 mg | Freq: Four times a day (QID) | INTRAMUSCULAR | Status: DC | PRN
Start: 2023-11-14 — End: 2023-11-15

## 2023-11-14 MED ORDER — ALBUTEROL SULFATE (2.5 MG/3ML) 0.083% IN NEBU
2.5000 mg | INHALATION_SOLUTION | Freq: Once | RESPIRATORY_TRACT | Status: DC
  Administered 2023-11-14: 2.5 mg via RESPIRATORY_TRACT

## 2023-11-14 MED ORDER — CEFAZOLIN SODIUM-DEXTROSE 2-4 GM/100ML-% IV SOLN
1.0000 g | Freq: Four times a day (QID) | INTRAVENOUS | Status: DC
  Filled 2023-11-14 (×2): qty 100

## 2023-11-14 MED ORDER — MIDAZOLAM HCL 5 MG/5ML IJ SOLN
INTRAMUSCULAR | Status: AC
  Filled 2023-11-14: qty 5

## 2023-11-14 MED ORDER — CHLORHEXIDINE GLUCONATE 4 % EX SOLN: 60.0000 mL | Freq: Once | CUTANEOUS | Status: DC

## 2023-11-14 MED ORDER — LOSARTAN POTASSIUM 50 MG PO TABS
50.0000 mg | ORAL_TABLET | Freq: Every day | ORAL | Status: DC
  Administered 2023-11-15: 50 mg via ORAL
  Filled 2023-11-14: qty 1

## 2023-11-14 MED ORDER — FENTANYL CITRATE (PF) 100 MCG/2ML IJ SOLN
INTRAMUSCULAR | Status: DC | PRN
  Administered 2023-11-14: 25 ug via INTRAVENOUS

## 2023-11-14 MED ORDER — IPRATROPIUM-ALBUTEROL 0.5-2.5 (3) MG/3ML IN SOLN: 3.0000 mL | Freq: Once | RESPIRATORY_TRACT | Status: DC

## 2023-11-14 MED ORDER — CEFAZOLIN SODIUM-DEXTROSE 2-4 GM/100ML-% IV SOLN
INTRAVENOUS | Status: AC
  Administered 2023-11-14: 2 g
  Filled 2023-11-14: qty 100

## 2023-11-14 MED ORDER — AMLODIPINE BESYLATE 10 MG PO TABS
10.0000 mg | ORAL_TABLET | Freq: Every day | ORAL | Status: DC
  Administered 2023-11-15: 10 mg via ORAL
  Filled 2023-11-14: qty 1

## 2023-11-14 MED ORDER — FENTANYL CITRATE (PF) 100 MCG/2ML IJ SOLN
INTRAMUSCULAR | Status: AC
  Filled 2023-11-14: qty 2

## 2023-11-14 MED ORDER — SODIUM CHLORIDE 0.9 % IV SOLN
80.0000 mg | INTRAVENOUS | Status: AC
  Administered 2023-11-14: 80 mg

## 2023-11-14 MED ORDER — MIDAZOLAM HCL 5 MG/5ML IJ SOLN
INTRAMUSCULAR | Status: DC | PRN
  Administered 2023-11-14: 1 mg via INTRAVENOUS

## 2023-11-14 MED ORDER — ATORVASTATIN CALCIUM 40 MG PO TABS
40.0000 mg | ORAL_TABLET | Freq: Every day | ORAL | Status: DC
  Administered 2023-11-15: 40 mg via ORAL
  Filled 2023-11-14: qty 1

## 2023-11-14 MED ORDER — SODIUM CHLORIDE 0.9 % IV SOLN
INTRAVENOUS | Status: AC
  Filled 2023-11-14: qty 2

## 2023-11-14 MED ORDER — LIDOCAINE HCL (PF) 1 % IJ SOLN
INTRAMUSCULAR | Status: DC | PRN
  Administered 2023-11-14: 60 mL

## 2023-11-14 MED ORDER — ACETAMINOPHEN 325 MG PO TABS: 325.0000 mg | ORAL_TABLET | ORAL | Status: DC | PRN

## 2023-11-14 MED ORDER — CEFAZOLIN SODIUM-DEXTROSE 1-4 GM/50ML-% IV SOLN
1.0000 g | Freq: Four times a day (QID) | INTRAVENOUS | Status: DC
  Administered 2023-11-15 (×2): 1 g via INTRAVENOUS
  Filled 2023-11-14 (×3): qty 50

## 2023-11-14 MED ORDER — CEFAZOLIN SODIUM-DEXTROSE 2-4 GM/100ML-% IV SOLN: 2.0000 g | INTRAVENOUS | Status: DC

## 2023-11-14 MED ORDER — GABAPENTIN 300 MG PO CAPS
300.0000 mg | ORAL_CAPSULE | Freq: Three times a day (TID) | ORAL | Status: DC
  Administered 2023-11-14 – 2023-11-15 (×2): 300 mg via ORAL
  Filled 2023-11-14 (×2): qty 1

## 2023-11-14 MED ORDER — SODIUM CHLORIDE 0.9 % IV SOLN: INTRAVENOUS | Status: DC

## 2023-11-14 SURGICAL SUPPLY — 14 items
CABLE SURGICAL S-101-97-12 (CABLE) ×3
CATH CPS LOCATOR 3D MED (CATHETERS) ×1
GUIDEWIRE VASC J-TIP .035X150 (WIRE) ×1
HELIX LOCKING TOOL (MISCELLANEOUS) ×1
KIT MICROPUNCTURE NIT STIFF (SHEATH) ×1
LEAD ULTIPACE 52 LPA1231/52 (Lead) ×1 IMPLANT
LEAD ULTIPACE 65 LPA1231/65 (Lead) ×1 IMPLANT
PACEMAKER ASSURITY DR-RF (Pacemaker) ×1 IMPLANT
PAD DEFIB RADIO PHYSIO CONN (PAD) ×1
POUCH AIGIS-R ANTIBACT PPM (Mesh General) ×1 IMPLANT
SHEATH 7FR PRELUDE SNAP 13 (SHEATH) ×2
SHEATH 9FR PRELUDE SNAP 13 (SHEATH) ×1
SLITTER AGILIS HISPRO (INSTRUMENTS) ×1
TRAY PACEMAKER INSERTION (PACKS) ×1

## 2023-11-14 NOTE — ED Notes (Signed)
Cardiology at bedside.

## 2023-11-14 NOTE — Progress Notes (Signed)
Acute Office Visit  Subjective:     Patient ID: Gary Lopez, male    DOB: 12-16-1950, 72 y.o.   MRN: [id number]  Chief Complaint  Patient presents with   Respiratory Distress    Having difficulty breathing for 4-5 days.     HPI Gary Lopez 72 year old male present November 14, 2023 for an acute visit concern for shortness of breath.  Past medical history of diabetes with hyperglycemia, osteoarthritis hyperlipidemia and chronic left hip pain He was seen at an urgent care on 11/09/2023 for SOB, Cough and sore throat. At that visit SARS-CoV-2 NAA, Influenza A Influenza B, Rapid RSV were Negative. He was sent home with  Tessalon pearls. Came in today c/o of SOB " it is hard to sleep at night" . Oxygen saturation was in the low 90, and decrease with walking, as client had to walk to x-ray.  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   Stress due to illness of family member 11/12/2022   Abnormal MMSE 11/12/2022   Hyperlipidemia due to type 2 diabetes mellitus (HCC) 11/12/2022   Uncontrolled type 2 diabetes mellitus with hyperglycemia (HCC) 11/12/2022   Onychomycosis of toenail 02/13/2023   Difficulty breathing 11/14/2023   Resolved Ambulatory Problems    Diagnosis Date Noted   Mixed hyperlipidemia 12/08/2018   New onset type 2 diabetes mellitus (HCC) 03/09/2019   Past Medical History:  Diagnosis Date   Abnormal EKG    Arthritis    Heart murmur    History of pneumonia    Hypertension    Type 2 diabetes mellitus (HCC)     Review of Systems  Constitutional:  Positive for chills. Negative for fever.  Respiratory:  Positive for cough, shortness of breath and  wheezing.   Cardiovascular:  Negative for chest pain, palpitations and leg swelling.  Gastrointestinal:  Negative for constipation, diarrhea, nausea and vomiting.  Musculoskeletal:  Negative for myalgias.  Skin:  Negative for itching and rash.  Neurological:  Positive for dizziness. Negative for headaches.  Endo/Heme/Allergies:  Negative for environmental allergies and polydipsia. Does not bruise/bleed easily.   Negative unless indicated in HPI    Objective:    BP (!) 171/64   Pulse (!) 37   Temp 97.7 F (36.5 C) (Temporal)   Ht 5\' 8"  (1.727 m)   Wt 219 lb (99.3 kg)   SpO2 97%   BMI 33.30 kg/m  BP Readings from Last 3 Encounters:  11/14/23 (!) 139/55  11/14/23 (!) 171/64  09/23/23 128/71   Wt Readings from Last 3 Encounters:  11/14/23 219 lb (99.3 kg)  11/14/23 219 lb (99.3 kg)  09/23/23 204 lb (92.5 kg)      Physical Exam Vitals and nursing note reviewed.  Constitutional:      General: He is in acute distress.  HENT:     Head: Normocephalic and atraumatic.  Eyes:     Extraocular Movements: Extraocular movements intact.     Conjunctiva/sclera: Conjunctivae normal.     Pupils: Pupils are equal, round, and reactive to light.  Neck:     Vascular: No carotid bruit.  Cardiovascular:     Rate and Rhythm: Bradycardia present.  Pulmonary:     Comments: Fine crackles Post Duoneb O2 94 Musculoskeletal:        General: Normal range of motion.     Cervical back: Normal range of motion and neck supple. No rigidity or tenderness.     Right lower leg: No edema.     Left lower leg: No edema.  Lymphadenopathy:     Cervical: No cervical adenopathy.  Skin:    General: Skin is warm and dry.  Neurological:     Mental Status: He is alert and oriented to person, place, and time. Mental status is at baseline.    X-ray:Small pleural effusions. Prominence of the interstitial vasculature   Results for orders placed or performed during the hospital encounter of 11/14/23   Comprehensive metabolic panel  Result Value Ref Range   Sodium 138 135 - 145 mmol/L   Potassium 3.8 3.5 - 5.1 mmol/L   Chloride 110 98 - 111 mmol/L   CO2 21 (L) 22 - 32 mmol/L   Glucose, Bld 182 (H) 70 - 99 mg/dL   BUN 23 8 - 23 mg/dL   Creatinine, Ser 0.86 0.61 - 1.24 mg/dL   Calcium 8.0 (L) 8.9 - 10.3 mg/dL   Total Protein 5.8 (L) 6.5 - 8.1 g/dL   Albumin 3.2 (L) 3.5 - 5.0 g/dL   AST 42 (H) 15 - 41 U/L   ALT 74 (H) 0 - 44 U/L   Alkaline Phosphatase 45 38 - 126 U/L   Total Bilirubin 0.8 <1.2 mg/dL   GFR, Estimated >57 >84 mL/min   Anion gap 7 5 - 15  Brain natriuretic peptide  Result Value Ref Range   B Natriuretic Peptide 771.8 (H) 0.0 - 100.0 pg/mL  CBC with Differential  Result Value Ref Range   WBC 9.7 4.0 - 10.5 K/uL   RBC 4.34 4.22 - 5.81 MIL/uL   Hemoglobin 13.5 13.0 - 17.0 g/dL   HCT 69.6 29.5 - 28.4 %   MCV 90.3 80.0 - 100.0 fL   MCH 31.1 26.0 - 34.0 pg   MCHC 34.4 30.0 - 36.0 g/dL   RDW 13.2 44.0 - 10.2 %   Platelets 209 150 - 400 K/uL   nRBC 0.0 0.0 - 0.2 %   Neutrophils Relative % 80 %   Neutro Abs 8.0 (H) 1.7 - 7.7 K/uL   Lymphocytes Relative 11 %   Lymphs Abs 1.0 0.7 - 4.0 K/uL   Monocytes Relative 6 %   Monocytes Absolute 0.6 0.1 - 1.0 K/uL   Eosinophils Relative 1 %   Eosinophils Absolute 0.1 0.0 - 0.5 K/uL   Basophils Relative 1 %   Basophils Absolute 0.1 0.0 - 0.1 K/uL   Immature Granulocytes 1 %   Abs Immature Granulocytes 0.06 0.00 - 0.07 K/uL  Magnesium  Result Value Ref Range   Magnesium 1.9 1.7 - 2.4 mg/dL  CBG monitoring, ED  Result Value Ref Range   Glucose-Capillary 179 (H) 70 - 99 mg/dL  ECHOCARDIOGRAM COMPLETE  Result Value Ref Range   Weight 3,504 oz   Height 68 in   BP 141/63 mmHg   S' Lateral 3.20 cm   AR max vel 1.92 cm2   AV Area VTI 1.76 cm2   AV Mean grad 35.3 mmHg   AV Peak grad 57.5 mmHg   Ao pk vel 3.79 m/s   AV Area mean vel 1.94 cm2   Est EF 70 - 75%   Troponin I (High Sensitivity)  Result Value Ref  Range    Troponin I (High Sensitivity) 66 (H) <18 ng/L        Assessment & Plan:  Difficulty breathing -     DG Chest 2 View -     Ipratropium Bromide -     Albuterol Sulfate   [name] 72 year old Caucasian male, seen for shortness of breath DuoNeb treatment administered at the office oxygen improved to 94% Based on client condition on school and he was transported to [facility] for further care Will follow-up with PCP posthospital discharge   The above assessment and management plan was discussed with the patient. The patient verbalized understanding of and has agreed to the management plan. Patient is aware to call the clinic if they develop any new symptoms or if symptoms persist or worsen. Patient is aware when to return to the clinic for a follow-up visit. Patient educated on when it is appropriate to go to the emergency department.  Return for Posthospital discharge.  Arrie Aran Santa Lighter, [place] [facility] [address] [place], [place] [address] [phone]  Note: This document was prepared by [employer] voice dictation technology and any errors that results from this process are unintentional.

## 2023-11-14 NOTE — ED Triage Notes (Signed)
Patient was seen at PCP today due weakness and sob that's been going on for 1 week.  Increased SOB with walking.  Patient was found to be in 3rd degree heart block in the 30's  PCP told him he has PNA.  RA 93% Placed on 3L [place] by EMS and atropine was given with no change. 18g LAC 20L FA  Bolus  500cc

## 2023-11-14 NOTE — ED Notes (Signed)
Pt undressed, belongings bagged and going home with son.

## 2023-11-14 NOTE — ED Notes (Addendum)
EDP at bedside, pt placed on zoll pads

## 2023-11-14 NOTE — ED Notes (Signed)
Spoke to [place] in lab and they will add one troponin

## 2023-11-14 NOTE — ED Notes (Signed)
X-ray at bedside

## 2023-11-14 NOTE — H&P (Signed)
ELECTROPHYSIOLOGY History and physical NOTE    Patient ID: MCKAI DEATS MRN: [id number], DOB/AGE: 1951-11-02 72 y.o.  Admit date: 11/14/2023 Date of Consult: 11/14/2023  Primary Physician: Raliegh Ip, DO Primary Cardiologist: None  Electrophysiologist:new to  Dr. Nelly Laurence   Referring Provider: Dr. Jeraldine Loots  Patient Profile: Gary Lopez is a 72 y.o. male with a history of HTN, HLD, T2DM, LBBB who is being seen today for the evaluation of CHB at the request of Dr. Jeraldine Loots.  HPI:  CHRISTPOHER WENGER is a 72 y.o. male with PMH as above who presented to his PCP office this morning with SOB. He has been having SOB for the past week after he went for a 2+ mile walk outdoors in the cold. He initially thought he was SOB from "breathing in the cold air" for too long, but when it persisted, he decided to see PCP.  He denies dizziness, chest pain, chest pressure, palpitations. His main complaint is ongoing SOB, states that it has been difficult to sleep at night lying flat. He was prescribed tessalon pearles for cough recently and that has helped his SOB. He also has had reduced appetite lately. No increased lower extremity edema.  He last ate breakfast this morning.  Of note, his brother had a PPM.     Labs Potassium3.8 (12/05 1114) Magnesium  1.9 (12/05 1114) Creatinine, ser  1.16 (12/05 1114) PLT  209 (12/05 1114) HGB  13.5 (12/05 1114) WBC 9.7 (12/05 1114) Troponin I (High Sensitivity)66* (12/05 1114).    Past Medical History:  Diagnosis Date   Abnormal EKG    left bundle branch blcok ekg 06-24-2019 saw dr Diona Browner cardiology for   Arthritis    oa   Heart murmur    History of pneumonia    20 years ago   Hypertension    Type 2 diabetes mellitus (HCC)    Diet controlled     Surgical History:  Past Surgical History:  Procedure Laterality Date   right wrist surgery  yrs ago   TOTAL HIP ARTHROPLASTY 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   Procedure: TOTAL HIP ARTHROPLASTY ANTERIOR  APPROACH;  Surgeon: Samson Frederic, MD;  Location: [facility];  Service: Orthopedics;  Laterality: Left;     (Not in a hospital admission)   Inpatient Medications:   Allergies: No Known Allergies  Family History  Problem Relation Age of Onset   Heart disease Mother    COPD Father      Physical Exam: Vitals:   11/14/23 1102 11/14/23 1103 11/14/23 1106 11/14/23 1155  BP:  (!) 175/88  (!) 141/63  Pulse:  (!) 35  (!) 32  Resp:  20  18  Temp:  98.2 F (36.8 C)    TempSrc:  Oral    SpO2:  92% 96% 95%  Weight: 99.3 kg  99.3 kg   Height: 5\' 8"  (1.727 m)  5\' 8"  (1.727 m)     GEN- NAD, A&O x 3, normal affect HEENT: Normocephalic, atraumatic, poor dentician Lungs- CTAB, Normal effort.  Heart- marked bradycardic rate and rhythm, No M/G/R.  GI- Soft, NT, firm but compressible.  Extremities- No clubbing, cyanosis, or edema   Radiology/Studies: ECHOCARDIOGRAM COMPLETE  Result Date: 11/14/2023    ECHOCARDIOGRAM REPORT   Patient Name:   LEWIN DANGER Date of Exam: 11/14/2023 Medical Rec #:  [id number]      Height:       68.0 in Accession #:    [id number]     Weight:  219.0 lb Date of Birth:  14-Dec-1950      BSA:          2.124 m Patient Age:    72 years       BP:           141/63 mmHg Patient Gender: M              HR:           33 bpm. Exam Location:  Inpatient Procedure: 2D Echo, Color Doppler, Cardiac Doppler and Intracardiac            Opacification Agent STAT ECHO Indications:    I44.2 Complete heart block  History:        Patient has prior history of Echocardiogram examinations, most                 recent 07/03/2019. Risk Factors:Hypertension, Diabetes and                 Dyslipidemia.  Sonographer:    Irving Burton Senior RDCS Referring Phys: [id number] Mariam Dollar TILLERY IMPRESSIONS  1. Patient in complete heart block.  2. Left ventricular ejection fraction, by estimation, is 70 to 75%. The left ventricle has hyperdynamic function. The left ventricle has no regional wall motion abnormalities.  There is mild concentric left ventricular hypertrophy. Left ventricular diastolic parameters are indeterminate.  3. Right ventricular systolic function is normal. The right ventricular size is mildly enlarged. There is moderately elevated pulmonary artery systolic pressure. The estimated right ventricular systolic pressure is 51.7 mmHg.  4. Left atrial size was severely dilated.  5. The mitral valve is normal in structure. Mild mitral valve regurgitation.  6. The aortic valve is severely calcified. Aortic valve regurgitation is not visualized. Moderate aortic valve stenosis. Aortic valve area, by VTI measures 1.76 cm. Aortic valve mean gradient measures 35.3 mmHg. Aortic valve Vmax measures 3.79 m/s. Will need to re-evaluate after resolution of CHB.  7. There is borderline dilatation of the ascending aorta, measuring 37 mm.  8. The inferior vena cava is dilated in size with <50% respiratory variability, suggesting right atrial pressure of 15 mmHg. FINDINGS  Left Ventricle: Left ventricular ejection fraction, by estimation, is 70 to 75%. The left ventricle has hyperdynamic function. The left ventricle has no regional wall motion abnormalities. Definity contrast agent was given IV to delineate the left ventricular endocardial borders. The left ventricular internal cavity size was normal in size. There is mild concentric left ventricular hypertrophy. Left ventricular diastolic parameters are indeterminate. Right Ventricle: The right ventricular size is mildly enlarged. No increase in right ventricular wall thickness. Right ventricular systolic function is normal. There is moderately elevated pulmonary artery systolic pressure. The tricuspid regurgitant velocity is 3.03 m/s, and with an assumed right atrial pressure of 15 mmHg, the estimated right ventricular systolic pressure is 51.7 mmHg. Left Atrium: Left atrial size was severely dilated. Right Atrium: Right atrial size was normal in size. Pericardium: There is no  evidence of pericardial effusion. Mitral Valve: The mitral valve is normal in structure. Mild mitral valve regurgitation. Tricuspid Valve: The tricuspid valve is normal in structure. Tricuspid valve regurgitation is mild. Aortic Valve: The aortic valve is calcified. Aortic valve regurgitation is trivial. Moderate aortic stenosis is present. Aortic valve mean gradient measures 35.3 mmHg. Aortic valve peak gradient measures 57.5 mmHg. Aortic valve area, by VTI measures 1.76  cm. Pulmonic Valve: The pulmonic valve was normal in structure. Pulmonic valve regurgitation is trivial. Aorta: The aortic root  is normal in size and structure. There is borderline dilatation of the ascending aorta, measuring 37 mm. Venous: The inferior vena cava is dilated in size with less than 50% respiratory variability, suggesting right atrial pressure of 15 mmHg. IAS/Shunts: The atrial septum is grossly normal.  LEFT VENTRICLE PLAX 2D LVIDd:         5.50 cm LVIDs:         3.20 cm LV PW:         1.10 cm LV IVS:        1.10 cm LVOT diam:     2.50 cm LV SV:         147 LV SV Index:   69 LVOT Area:     4.91 cm  RIGHT VENTRICLE TAPSE (M-mode): 2.7 cm LEFT ATRIUM              Index        RIGHT ATRIUM           Index LA diam:        5.30 cm  2.50 cm/m   RA Area:     19.90 cm LA Vol (A2C):   128.0 ml 60.26 ml/m  RA Volume:   57.40 ml  27.02 ml/m LA Vol (A4C):   116.0 ml 54.61 ml/m LA Biplane Vol: 126.0 ml 59.32 ml/m  AORTIC VALVE AV Area (Vmax):    1.92 cm AV Area (Vmean):   1.94 cm AV Area (VTI):     1.76 cm AV Vmax:           379.00 cm/s AV Vmean:          283.667 cm/s AV VTI:            0.837 m AV Peak Grad:      57.5 mmHg AV Mean Grad:      35.3 mmHg LVOT Vmax:         148.00 cm/s LVOT Vmean:        112.000 cm/s LVOT VTI:          0.300 m LVOT/AV VTI ratio: 0.36  AORTA Ao Root diam: 3.20 cm Ao Asc diam:  3.70 cm TRICUSPID VALVE TR Peak grad:   36.7 mmHg TR Vmax:        303.00 cm/s  SHUNTS Systemic VTI:  0.30 m Systemic Diam: 2.50 cm  Aditya Sabharwal Electronically signed by Dorthula Nettles Signature Date/Time: 11/14/2023/12:56:20 PM    Final    DG Chest Port 1 View  Result Date: 11/14/2023 CLINICAL DATA:  Weakness and shortness of breath. EXAM: PORTABLE CHEST 1 VIEW COMPARISON:  11/14/2023, 9:52 a.m. FINDINGS: There is mild pulmonary vascular congestion with bilateral basilar and hilar predominance. There are layering small bilateral pleural effusions, better seen on the lateral radiograph performed earlier the same day. Bilateral lung fields are otherwise clear. No dense consolidation or lung collapse. Stable cardio-mediastinal silhouette. No acute osseous abnormalities. The soft tissues are within normal limits. IMPRESSION: *Essentially stable exam favoring mild congestive heart failure/pulmonary edema. No acute consolidation or lung collapse. Electronically Signed   By: Jules Schick M.D.   On: 11/14/2023 12:15   DG Chest 2 View  Result Date: 11/14/2023 CLINICAL DATA:  Difficulty breathing EXAM: CHEST - 2 VIEW COMPARISON:  None Available. FINDINGS: Normal cardiopericardial silhouette. Slight prominence of the central vasculature. Small pleural effusions. No pneumothorax. Degenerative changes of the spine on lateral view. No consolidation. IMPRESSION: Small pleural effusions. Prominence of the interstitial vasculature. Electronically Signed   By:  Karen Kays M.D.   On: 11/14/2023 10:20    EKG: 11/14/2023 - CHB with escape of 47bpm, single conducted QRS with LBBB morphology  06/24/2019 - SR with LBBB, rate 96; QRS 160 (personally reviewed)  TELEMETRY: intermittently high-grade HB, rates 30-50s (personally reviewed)    Assessment/Plan: #) CHB Patient not on any AV nodal blocking agents at home Previous EKG with LBBB Updated TTE with hyperdynamic LVEF 70-75%, no WMA Previous TSH normal No clear reversible cause of CHB noted Explained risks, benefits, and alternatives to PPM implantation, including but not limited to  bleeding, infection, pneumothorax, pericardial effusion, lead dislodgement, heart attack, stroke, or death.  Pt verbalized understanding and agrees to proceed.    Not on [facility]  Dr. Nelly Laurence to see    #) HTN Permissible HTN at this time Home meds: 10mg  amlodipine, 50mg  losartan Hold home meds at this time           For questions or updates, please contact [facility] Please consult [email or web address] for contact info under Cardiology/STEMI.  Signed, Sherie Don, NP  11/14/2023 12:57 PM

## 2023-11-14 NOTE — ED Notes (Signed)
Echo at bedside

## 2023-11-14 NOTE — Progress Notes (Signed)
Echocardiogram 2D Echocardiogram has been performed.  Warren Lacy Roderick Calo RDCS 11/14/2023, 12:38 PM

## 2023-11-14 NOTE — ED Notes (Signed)
Pt taken to OR.

## 2023-11-14 NOTE — ED Provider Notes (Signed)
[place] EMERGENCY DEPARTMENT AT [facility] Provider Note   CSN: [id number] Arrival date & time: 11/14/23  1059     History  Chief Complaint  Patient presents with   Bradycardia    Gary Lopez is a 72 y.o. male.  HPI Patient presents via EMS from primary care practice due to concern for fatigue.  He notes that over the past days he has had decreasing energy, particular with exertion, but no focal pain.  He states that with minimal exertion he is now easily fatigued, with associated dyspnea.  No recent change in diet, though he did recently have a change in his medications secondary to lower extremity edema.  With his cardiologist he reports cessation of amlodipine, increase in ARB.  Per clinic notes patient may have pneumonia, was found to have evidence for heart block and was sent here for evaluation. Home Medications Prior to Admission medications   Medication Sig Start Date End Date Taking? Authorizing Provider  Accu-Chek Softclix Lancets lancets Test BS up to twice daily Dx E11.65 08/05/23   Delynn Flavin M, DO  amLODipine (NORVASC) 10 MG tablet Take 1 tablet (10 mg total) by mouth daily. 09/23/23   Raliegh Ip, DO  atorvastatin (LIPITOR) 40 MG tablet Take 1 tablet (40 mg total) by mouth daily. 09/23/23   Raliegh Ip, DO  benzonatate (TESSALON) 100 MG capsule Take by mouth. 11/09/23 11/16/23  [provider]  blood glucose meter kit and supplies Dispense based on patient and insurance preference. Use up to 2 times daily as directed. (FOR ICD-10 E11.65 06/19/21   Raliegh Ip, DO  gabapentin (NEURONTIN) 300 MG capsule Take 1 capsule (300 mg total) by mouth 3 (three) times daily as needed. 09/04/22   Raliegh Ip, DO  glucose blood (ACCU-CHEK GUIDE) test strip Test BS up to twice daily Dx E11.65 08/05/23   Delynn Flavin M, DO  losartan (COZAAR) 50 MG tablet Take 1 tablet (50 mg total) by mouth daily. 09/23/23   Raliegh Ip, DO  metFORMIN (GLUCOPHAGE-XR) 500 MG 24 hr tablet Take 2 tablets (1,000 mg total) by mouth 2 (two) times daily with a meal. 09/23/23   Raliegh Ip, DO      Allergies    Patient has no known allergies.    Review of Systems   Review of Systems  Physical Exam Updated Vital Signs BP 130/77   Pulse (!) 32   Temp 98.1 F (36.7 C) (Oral)   Resp 11   Ht 5\' 8"  (1.727 m)   Wt 99.3 kg   SpO2 99%   BMI 33.30 kg/m  Physical Exam Vitals and nursing note reviewed.  Constitutional:      General: He is not in acute distress.    Appearance: He is well-developed.  HENT:     Head: Normocephalic and atraumatic.  Eyes:     Conjunctiva/sclera: Conjunctivae normal.  Cardiovascular:     Rate and Rhythm: Regular rhythm. Bradycardia present.  Pulmonary:     Effort: Pulmonary effort is normal. No respiratory distress.     Breath sounds: No stridor.  Abdominal:     General: There is no distension.  Musculoskeletal:     Right lower leg: Edema present.     Left lower leg: Edema present.  Skin:    General: Skin is warm and dry.  Neurological:     Mental Status: He is alert and oriented to person, place, and time.  ED Results / Procedures / Treatments   Labs (all labs ordered are listed, but only abnormal results are displayed) Labs Reviewed  COMPREHENSIVE METABOLIC PANEL - Abnormal; Notable for the following components:      Result Value   CO2 21 (*)    Glucose, Bld 182 (*)    Calcium 8.0 (*)    Total Protein 5.8 (*)    Albumin 3.2 (*)    AST 42 (*)    ALT 74 (*)    All other components within normal limits  BRAIN NATRIURETIC PEPTIDE - Abnormal; Notable for the following components:   B Natriuretic Peptide 771.8 (*)    All other components within normal limits  CBC WITH DIFFERENTIAL/PLATELET - Abnormal; Notable for the following components:   Neutro Abs 8.0 (*)    All other components within normal limits  CBG MONITORING, ED - Abnormal; Notable for the following  components:   Glucose-Capillary 179 (*)    All other components within normal limits  TROPONIN I (HIGH SENSITIVITY) - Abnormal; Notable for the following components:   Troponin I (High Sensitivity) 66 (*)    All other components within normal limits  MAGNESIUM  TROPONIN I (HIGH SENSITIVITY)    EKG EKG Interpretation Date/Time:  Thursday November 14 2023 11:03:18 EST Ventricular Rate:  47 PR Interval:    QRS Duration:  143 QT Interval:  695 QTC Calculation: 538 R Axis:   67  Text Interpretation: Complete AV block with wide QRS complex Ventricular premature complex IVCD, consider atypical RBBB Confirmed by Gerhard Munch [phone]) on 11/14/2023 11:18:39 AM  Radiology ECHOCARDIOGRAM COMPLETE  Result Date: 11/14/2023    ECHOCARDIOGRAM REPORT   Patient Name:   Gary Lopez [facility] Date of Exam: 11/14/2023 Medical Rec #:  [id number]      Height:       68.0 in Accession #:    [id number]     Weight:       219.0 lb Date of Birth:  [birth year]      BSA:          2.124 m Patient Age:    72 years       BP:           141/63 mmHg Patient Gender: M              HR:           33 bpm. Exam Location:  Inpatient Procedure: 2D Echo, Color Doppler, Cardiac Doppler and Intracardiac            Opacification Agent STAT ECHO Indications:    I44.2 Complete heart block  History:        Patient has prior history of Echocardiogram examinations, most                 recent 07/03/2019. Risk Factors:Hypertension, Diabetes and                 Dyslipidemia.  Sonographer:    Irving Burton Senior RDCS Referring Phys: [id number] Mariam Dollar TILLERY IMPRESSIONS  1. Patient in complete heart block.  2. Left ventricular ejection fraction, by estimation, is 70 to 75%. The left ventricle has hyperdynamic function. The left ventricle has no regional wall motion abnormalities. There is mild concentric left ventricular hypertrophy. Left ventricular diastolic parameters are indeterminate.  3. Right ventricular systolic function is normal. The right  ventricular size is mildly enlarged. There is moderately elevated pulmonary artery systolic pressure. The estimated right ventricular systolic pressure is 51.7 mmHg.  4. Left atrial size was severely dilated.  5. The mitral valve is normal in structure. Mild mitral valve regurgitation.  6. The aortic valve is severely calcified. Aortic valve regurgitation is not visualized. Moderate aortic valve stenosis. Aortic valve area, by VTI measures 1.76 cm. Aortic valve mean gradient measures 35.3 mmHg. Aortic valve Vmax measures 3.79 m/s. Will need to re-evaluate after resolution of CHB.  7. There is borderline dilatation of the ascending aorta, measuring 37 mm.  8. The inferior vena cava is dilated in size with <50% respiratory variability, suggesting right atrial pressure of 15 mmHg. FINDINGS  Left Ventricle: Left ventricular ejection fraction, by estimation, is 70 to 75%. The left ventricle has hyperdynamic function. The left ventricle has no regional wall motion abnormalities. Definity contrast agent was given IV to delineate the left ventricular endocardial borders. The left ventricular internal cavity size was normal in size. There is mild concentric left ventricular hypertrophy. Left ventricular diastolic parameters are indeterminate. Right Ventricle: The right ventricular size is mildly enlarged. No increase in right ventricular wall thickness. Right ventricular systolic function is normal. There is moderately elevated pulmonary artery systolic pressure. The tricuspid regurgitant velocity is 3.03 m/s, and with an assumed right atrial pressure of 15 mmHg, the estimated right ventricular systolic pressure is 51.7 mmHg. Left Atrium: Left atrial size was severely dilated. Right Atrium: Right atrial size was normal in size. Pericardium: There is no evidence of pericardial effusion. Mitral Valve: The mitral valve is normal in structure. Mild mitral valve regurgitation. Tricuspid Valve: The tricuspid valve is normal in  structure. Tricuspid valve regurgitation is mild. Aortic Valve: The aortic valve is calcified. Aortic valve regurgitation is trivial. Moderate aortic stenosis is present. Aortic valve mean gradient measures 35.3 mmHg. Aortic valve peak gradient measures 57.5 mmHg. Aortic valve area, by VTI measures 1.76  cm. Pulmonic Valve: The pulmonic valve was normal in structure. Pulmonic valve regurgitation is trivial. Aorta: The aortic root is normal in size and structure. There is borderline dilatation of the ascending aorta, measuring 37 mm. Venous: The inferior vena cava is dilated in size with less than 50% respiratory variability, suggesting right atrial pressure of 15 mmHg. IAS/Shunts: The atrial septum is grossly normal.  LEFT VENTRICLE PLAX 2D LVIDd:         5.50 cm LVIDs:         3.20 cm LV PW:         1.10 cm LV IVS:        1.10 cm LVOT diam:     2.50 cm LV SV:         147 LV SV Index:   69 LVOT Area:     4.91 cm  RIGHT VENTRICLE TAPSE (M-mode): 2.7 cm LEFT ATRIUM              Index        RIGHT ATRIUM           Index LA diam:        5.30 cm  2.50 cm/m   RA Area:     19.90 cm LA Vol (A2C):   128.0 ml 60.26 ml/m  RA Volume:   57.40 ml  27.02 ml/m LA Vol (A4C):   116.0 ml 54.61 ml/m LA Biplane Vol: 126.0 ml 59.32 ml/m  AORTIC VALVE AV Area (Vmax):    1.92 cm AV Area (Vmean):   1.94 cm AV Area (VTI):     1.76 cm AV Vmax:  379.00 cm/s AV Vmean:          283.667 cm/s AV VTI:            0.837 m AV Peak Grad:      57.5 mmHg AV Mean Grad:      35.3 mmHg LVOT Vmax:         148.00 cm/s LVOT Vmean:        112.000 cm/s LVOT VTI:          0.300 m LVOT/AV VTI ratio: 0.36  AORTA Ao Root diam: 3.20 cm Ao Asc diam:  3.70 cm TRICUSPID VALVE TR Peak grad:   36.7 mmHg TR Vmax:        303.00 cm/s  SHUNTS Systemic VTI:  0.30 m Systemic Diam: 2.50 cm Aditya Sabharwal Electronically signed by Dorthula Nettles Signature Date/Time: 11/14/2023/12:56:20 PM    Final    DG Chest Port 1 View  Result Date: 11/14/2023 CLINICAL  DATA:  Weakness and shortness of breath. EXAM: PORTABLE CHEST 1 VIEW COMPARISON:  11/14/2023, 9:52 a.m. FINDINGS: There is mild pulmonary vascular congestion with bilateral basilar and hilar predominance. There are layering small bilateral pleural effusions, better seen on the lateral radiograph performed earlier the same day. Bilateral lung fields are otherwise clear. No dense consolidation or lung collapse. Stable cardio-mediastinal silhouette. No acute osseous abnormalities. The soft tissues are within normal limits. IMPRESSION: *Essentially stable exam favoring mild congestive heart failure/pulmonary edema. No acute consolidation or lung collapse. Electronically Signed   By: Jules Schick M.D.   On: 11/14/2023 12:15   DG Chest 2 View  Result Date: 11/14/2023 CLINICAL DATA:  Difficulty breathing EXAM: CHEST - 2 VIEW COMPARISON:  None Available. FINDINGS: Normal cardiopericardial silhouette. Slight prominence of the central vasculature. Small pleural effusions. No pneumothorax. Degenerative changes of the spine on lateral view. No consolidation. IMPRESSION: Small pleural effusions. Prominence of the interstitial vasculature. Electronically Signed   By: Karen Kays M.D.   On: 11/14/2023 10:20    Procedures Procedures    Medications Ordered in ED Medications  perflutren lipid microspheres (DEFINITY) IV suspension (8 mLs Intravenous Given 11/14/23 1238)  0.9 %  sodium chloride infusion ( Intravenous New Bag/Given 11/14/23 1308)    ED Course/ Medical Decision Making/ A&P                                 Medical Decision Making Obese adult male presents with fatigue, particularly patient is awake and alert, has immediately concerning findings consistent with complete heart block.  Patient placed on continuous monitoring, pacer to bedside, cardiology consulted. Cardiac 30s, complete heart block, abnormal Pulse ox 96% room air normal   Amount and/or Complexity of Data Reviewed Independent  Historian: EMS External Data Reviewed: notes. Labs: ordered. Decision-making details documented in ED Course. Radiology: ordered and independent interpretation performed. Decision-making details documented in ED Course. ECG/medicine tests: ordered and independent interpretation performed. Decision-making details documented in ED Course.  Risk Prescription drug management. Decision regarding hospitalization.   3:15 PM Patient has been seen, evaluated by cardiology, has had stat bedside echo, remains bradycardic, though minimally symptomatic at rest.  Plan for admission for pacemaker placement.  Final Clinical Impression(s) / ED Diagnoses  CRITICAL CARE Performed by: Gerhard Munch Total critical care time: 35 minutes Critical care time was exclusive of separately billable procedures and treating other patients. Critical care was necessary to treat or prevent imminent or life-threatening deterioration. Critical care  was time spent personally by me on the following activities: development of treatment plan with patient and/or surrogate as well as nursing, discussions with consultants, evaluation of patient's response to treatment, examination of patient, obtaining history from patient or surrogate, ordering and performing treatments and interventions, ordering and review of laboratory studies, ordering and review of radiographic studies, pulse oximetry and re-evaluation of patient's condition.    Gerhard Munch, MD 11/14/23 1515

## 2023-11-15 ENCOUNTER — Inpatient Hospital Stay (HOSPITAL_COMMUNITY): Payer: Medicare Other

## 2023-11-15 ENCOUNTER — Encounter (HOSPITAL_COMMUNITY): Payer: Self-pay | Admitting: Cardiovascular Disease

## 2023-11-15 ENCOUNTER — Telehealth: Payer: Self-pay

## 2023-11-15 DIAGNOSIS — I442 Atrioventricular block, complete: Secondary | ICD-10-CM | POA: Diagnosis present

## 2023-11-15 DIAGNOSIS — Z96642 Presence of left artificial hip joint: Secondary | ICD-10-CM | POA: Diagnosis not present

## 2023-11-15 DIAGNOSIS — J90 Pleural effusion, not elsewhere classified: Secondary | ICD-10-CM | POA: Diagnosis not present

## 2023-11-15 DIAGNOSIS — Z950 Presence of cardiac pacemaker: Secondary | ICD-10-CM | POA: Diagnosis not present

## 2023-11-15 DIAGNOSIS — J811 Chronic pulmonary edema: Secondary | ICD-10-CM | POA: Diagnosis not present

## 2023-11-15 DIAGNOSIS — Z7984 Long term (current) use of oral hypoglycemic drugs: Secondary | ICD-10-CM | POA: Diagnosis not present

## 2023-11-15 DIAGNOSIS — I110 Hypertensive heart disease with heart failure: Secondary | ICD-10-CM | POA: Diagnosis not present

## 2023-11-15 DIAGNOSIS — E119 Type 2 diabetes mellitus without complications: Secondary | ICD-10-CM | POA: Diagnosis not present

## 2023-11-15 DIAGNOSIS — R001 Bradycardia, unspecified: Secondary | ICD-10-CM | POA: Diagnosis not present

## 2023-11-15 DIAGNOSIS — I5031 Acute diastolic (congestive) heart failure: Secondary | ICD-10-CM | POA: Diagnosis not present

## 2023-11-15 DIAGNOSIS — Z79899 Other long term (current) drug therapy: Secondary | ICD-10-CM | POA: Diagnosis not present

## 2023-11-15 NOTE — Telephone Encounter (Signed)
Follow-up after same day discharge: Implant date: 11/14/2023 MD: Mealor Device: [facility] [employer] [id number] Assurity MRI Location: Left Chest   Wound check visit: 11/29/2023 90 day MD follow-up: 02/14/2024  Remote Transmission received:  Dressing/sling removed:   Confirm OAC restart on:   Please continue to monitor your cardiac device site for redness, swelling, and drainage. Call the device clinic at [phone] if you experience these symptoms, fever/chills, or have questions about your device.   Remote monitoring is used to monitor your cardiac device from home. This monitoring is scheduled every 91 days by our office. It allows [place] to keep an eye on the functioning of your device to ensure it is working properly.   LMOVM for pt to give [place] a call back.

## 2023-11-15 NOTE — Discharge Summary (Signed)
ELECTROPHYSIOLOGY PROCEDURE DISCHARGE SUMMARY    Patient ID: BRYSEN MEDICA,  MRN: [id number], DOB/AGE: February 18, 1951 72 y.o.  Admit date: 11/14/2023 Discharge date: 11/15/2023  Primary Care Physician: Raliegh Ip, DO  Primary Cardiologist: None  Electrophysiologist: Dr. Nelly Laurence   Primary Discharge Diagnosis:  Symptomatic bradycardia in the setting of intermittent CHB status post pacemaker implantation this admission  Secondary Discharge Diagnosis:  HTN DM2 HLD  Not on File   Procedures This Admission:  1.  Implantation of a Abbott Dual Chamber PPM on 11/14/2023 by Dr. Nelly Laurence. The patient received a Abbott Assurity [id number] with a Abbott Ultipace 1231-52 right atrial lead and a Abbott Ultipace 1231-65 right ventricular lead.  There were no immediate post procedure complications.   2.  CXR on 11/15/2023 demonstrated no pneumothorax status post device implantation.       Brief HPI: Gary Lopez is a 72 y.o. male was admitted for symptomatic bradycardia and electrophysiology team asked to see for consideration of PPM implantation.  Past medical history includes above.  The patient has had AV block without reversible causes identified.  Risks, benefits, and alternatives to PPM implantation were reviewed with the patient who wished to proceed.   Hospital Course:  The patient was admitted and underwent implantation of a Abbott dual chamber PPM with details as outlined above.  He was monitored on telemetry overnight which demonstrated appropriate pacing.  Left chest was without hematoma or ecchymosis.  The device was interrogated and found to be functioning normally.  CXR was obtained and demonstrated no pneumothorax status post device implantation.  Wound care, arm mobility, and restrictions were reviewed with the patient.  The patient was examined and considered stable for discharge to home.    Anticoagulation resumption This patient is not on anticoagulation     Physical  Exam: Vitals:   11/14/23 2256 11/14/23 2328 11/15/23 0414 11/15/23 0817  BP: (!) 148/94 (!) 159/89 (!) 168/95 (!) 157/88  Pulse: 86 87 (!) 105 100  Resp:   19 16  Temp:  97.6 F (36.4 C) 98.3 F (36.8 C) 98.3 F (36.8 C)  TempSrc:  Oral Oral Oral  SpO2: 95% 92% 95% 99%  Weight:      Height:        GEN- NAD. A&O x 3.  HEENT: Normocephalic, atraumatic Lungs- CTAB, Normal effort.  Heart- RRR, No M/G/R.  GI- Soft, NT, ND.  Extremities- No clubbing, cyanosis, or edema;  Skin- warm and dry, no rash or lesion, left chest without hematoma/ecchymosis  Discharge Medications:  Allergies as of 11/15/2023   Not on File      Medication List     TAKE these medications    Accu-Chek Guide test strip Generic drug: glucose blood Test BS up to twice daily Dx E11.65   Accu-Chek Softclix Lancets lancets Test BS up to twice daily Dx E11.65   acetaminophen 500 MG tablet Commonly known as: TYLENOL Take 500-1,000 mg by mouth every 6 (six) hours as needed for moderate pain (pain score 4-6).   amLODipine 10 MG tablet Commonly known as: NORVASC Take 1 tablet (10 mg total) by mouth daily.   atorvastatin 40 MG tablet Commonly known as: Lipitor Take 1 tablet (40 mg total) by mouth daily.   benzonatate 100 MG capsule Commonly known as: TESSALON Take 100 mg by mouth 3 (three) times daily as needed for cough.   blood glucose meter kit and supplies Dispense based on patient and insurance preference. Use up to  2 times daily as directed. (FOR ICD-10 E11.65   losartan 50 MG tablet Commonly known as: COZAAR Take 1 tablet (50 mg total) by mouth daily.   metFORMIN 500 MG 24 hr tablet Commonly known as: GLUCOPHAGE-XR Take 2 tablets (1,000 mg total) by mouth 2 (two) times daily with a meal.        Disposition:  Home with usual follow up as in AVS   Duration of Discharge Encounter: Greater than 30 minutes including physician time.  Dustin Flock, PA-C   11/15/2023 9:46 AM

## 2023-11-15 NOTE — Care Management CC44 (Signed)
Condition Code 44 Documentation Completed  Patient Details  Name: ARTHUR MICKLEY MRN: [id number] Date of Birth: 1951/09/29   Condition Code 44 given:  Yes Patient signature on Condition Code 44 notice:  Yes Documentation of 2 MD's agreement:  Yes Code 44 added to claim:  Yes    Durenda Guthrie, RN 11/15/2023, 11:09 AM

## 2023-11-15 NOTE — Discharge Instructions (Signed)
After Your Pacemaker   You have a Abbott Pacemaker  ACTIVITY Do not lift your arm above shoulder height for 1 week after your procedure. After 7 days, you may progress as below.  You should remove your sling 24 hours after your procedure, unless otherwise instructed by your provider.     Friday November 22, 2023  Saturday November 23, 2023 Sunday November 24, 2023 Monday November 25, 2023   Do not lift, push, pull, or carry anything over 10 pounds with the affected arm until 6 weeks (Friday December 27, 2023 ) after your procedure.   You may drive AFTER your wound check, unless you have been told otherwise by your provider.   Ask your healthcare provider when you can go back to work   INCISION/Dressing If you are on a blood thinner such as Coumadin, Xarelto, Eliquis, Plavix, or Pradaxa please confirm with your provider when this should be resumed.   If large square, outer bandage is left in place, this can be removed after 24 hours from your procedure. Do not remove steri-strips or glue as below.   If a PRESSURE DRESSING (a bulky dressing that usually goes up over your shoulder) was applied or left in place, please follow instructions given by your provider on when to return to have this removed.   Monitor your Pacemaker site for redness, swelling, and drainage. Call the device clinic at [phone] if you experience these symptoms or fever/chills.  If your incision is sealed with Steri-strips or staples, you may shower 7 days after your procedure or when told by your provider. Do not remove the steri-strips or let the shower hit directly on your site. You may wash around your site with soap and water.    If you were discharged in a sling, please do not wear this during the day more than 48 hours after your surgery unless otherwise instructed. This may increase the risk of stiffness and soreness in your shoulder.   Avoid lotions, ointments, or perfumes over your incision until it is  well-healed.  You may use a hot tub or a pool AFTER your wound check appointment if the incision is completely closed.  Pacemaker Alerts:  Some alerts are vibratory and others beep. These are NOT emergencies. Please call our office to let us know. If this occurs at night or on weekends, it can wait until the next business day. Send a remote transmission.  If your device is capable of reading fluid status (for heart failure), you will be offered monthly monitoring to review this with you.   DEVICE MANAGEMENT Remote monitoring is used to monitor your pacemaker from home. This monitoring is scheduled every 91 days by our office. It allows [place] to keep an eye on the functioning of your device to ensure it is working properly. You will routinely see your Electrophysiologist annually (more often if necessary).   You should receive your ID card for your new device in 4-8 weeks. Keep this card with you at all times once received. Consider wearing a medical alert bracelet or necklace.  Your Pacemaker may be MRI compatible. This will be discussed at your next office visit/wound check.  You should avoid contact with strong electric or magnetic fields.   Do not use amateur (ham) radio equipment or electric (arc) welding torches. MP3 player headphones with magnets should not be used. Some devices are safe to use if held at least 12 inches (30 cm) from your Pacemaker. These include power tools, lawn  mowers, and speakers. If you are unsure if something is safe to use, ask your health care provider.  When using your cell phone, hold it to the ear that is on the opposite side from the Pacemaker. Do not leave your cell phone in a pocket over the Pacemaker.  You may safely use electric blankets, heating pads, computers, and microwave ovens.  Call the office right away if: You have chest pain. You feel more short of breath than you have felt before. You feel more light-headed than you have felt before. Your  incision starts to open up.  This information is not intended to replace advice given to you by your health care provider. Make sure you discuss any questions you have with your health care provider.

## 2023-11-15 NOTE — Care Management Obs Status (Signed)
MEDICARE OBSERVATION STATUS NOTIFICATION   Patient Details  Name: Gary Lopez MRN: [id number] Date of Birth: 26-Jul-1951   Medicare Observation Status Notification Given:  Yes    Durenda Guthrie, RN 11/15/2023, 11:09 AM

## 2023-11-15 NOTE — Progress Notes (Signed)
Discharge instructions (including medications) discussed with and copy provided to patient/caregiver 

## 2023-11-15 NOTE — Plan of Care (Signed)
  Problem: Education: Goal: Knowledge of General Education information will improve Description: Including pain rating scale, medication(s)/side effects and non-pharmacologic comfort measures Outcome: Adequate for Discharge   Problem: Health Behavior/Discharge Planning: Goal: Ability to manage health-related needs will improve Outcome: Adequate for Discharge   Problem: Clinical Measurements: Goal: Ability to maintain clinical measurements within normal limits will improve Outcome: Adequate for Discharge Goal: Will remain free from infection Outcome: Adequate for Discharge Goal: Diagnostic test results will improve Outcome: Adequate for Discharge Goal: Respiratory complications will improve Outcome: Adequate for Discharge Goal: Cardiovascular complication will be avoided Outcome: Adequate for Discharge   Problem: Activity: Goal: Risk for activity intolerance will decrease Outcome: Adequate for Discharge   Problem: Nutrition: Goal: Adequate nutrition will be maintained Outcome: Adequate for Discharge   Problem: Coping: Goal: Level of anxiety will decrease Outcome: Adequate for Discharge   Problem: Elimination: Goal: Will not experience complications related to bowel motility Outcome: Adequate for Discharge Goal: Will not experience complications related to urinary retention Outcome: Adequate for Discharge   Problem: Pain Management: Goal: General experience of comfort will improve Outcome: Adequate for Discharge   Problem: Safety: Goal: Ability to remain free from injury will improve Outcome: Adequate for Discharge   Problem: Skin Integrity: Goal: Risk for impaired skin integrity will decrease Outcome: Adequate for Discharge   Problem: Education: Goal: Knowledge of cardiac device and self-care will improve Outcome: Adequate for Discharge Goal: Ability to safely manage health related needs after discharge will improve Outcome: Adequate for Discharge Goal:  Individualized Educational Video(s) Outcome: Adequate for Discharge   Problem: Cardiac: Goal: Ability to achieve and maintain adequate cardiopulmonary perfusion will improve Outcome: Adequate for Discharge

## 2023-11-18 ENCOUNTER — Telehealth: Payer: Self-pay

## 2023-11-18 NOTE — Transitions of Care (Post Inpatient/ED Visit) (Unsigned)
   11/18/2023  Name: Gary Lopez MRN: [id number] DOB: [birth year]  Today's TOC FU Call Status: Today's TOC FU Call Status:: Unsuccessful Call (1st Attempt) Unsuccessful Call (1st Attempt) Date: 11/18/23  Attempted to reach the patient regarding the most recent Inpatient/ED visit.  Follow Up Plan: Additional outreach attempts will be made to reach the patient to complete the Transitions of Care (Post Inpatient/ED visit) call.    Chasmine Lender, CMA  [facility] AW[facility] Team Direct Dial: [phone]

## 2023-11-18 NOTE — Telephone Encounter (Signed)
I spoke with the patient about same day discharge. He not wearing a sling. The hospital took the dressing off. The patient is complain about fluid in hands and feet. I also had the pt send a manual transmission with his home monitor. The patient would like to speak with the nurse about the fluid.

## 2023-11-18 NOTE — Telephone Encounter (Signed)
Patient reports of increased fluid in hands and feet since leaving the hospital. Reports today the swelling has improved since yesterday. Patient does not take diuretics. Denies shortness of breath. Pt advised fluid shift may occur while in the hospital due to iv and fluids given during procedures. Advised patient to continue to monitor and if still present Wednesday to follow up with his PCP. Pt voiced understanding and agreeable to plan. Pt will call back if worsening symptoms occur.

## 2023-11-19 ENCOUNTER — Telehealth: Payer: Self-pay

## 2023-11-19 NOTE — Telephone Encounter (Signed)
Alert received from [employer] for 1 AMS EGM, duration 52sec, c/w AF/AFL.  No hx of PAF or OAC per EPIC Route to triage.  Routing to Dr. Nelly Laurence to advise further.

## 2023-11-27 NOTE — Progress Notes (Signed)
  Electrophysiology Office Note:   ID:  Atsushi, Trindade [birth year], MRN [id number]  Primary Cardiologist: None Electrophysiologist: Maurice Small, MD      History of Present Illness:   AUGUSTIN VISCOMI is a 72 y.o. male with h/o CHB s/p PPM, HTN, HLD, and DM2 seen today for routine electrophysiology followup.   Since last being seen in our clinic the patient reports doing very well.  he denies chest pain, palpitations, dyspnea, PND, orthopnea, nausea, vomiting, dizziness, syncope, edema, weight gain, or early satiety.   Review of systems complete and found to be negative unless listed in HPI.   EP Information / Studies Reviewed:    EKG is not ordered today. EKG from 11/15/2023 reviewed which showed AS-VP at 99 bpm with QRS 150 ms       PPM Interrogation-  reviewed in detail today,  See PACEART report.  Device History: Abbott Dual Chamber PPM implanted 11/14/2023 for CHB  Echo 11/14/2023 LVEF 70-75%, Severe LAE, mild MR, RAP ~15 mmHg  Physical Exam:   VS:  BP (!) 154/88   Pulse 76   Ht 5\' 8"  (1.727 m)   Wt 200 lb 9.6 oz (91 kg)   SpO2 96%   BMI 30.50 kg/m     Wt Readings from Last 3 Encounters:  11/29/23 200 lb 9.6 oz (91 kg)  11/14/23 218 lb 7.6 oz (99.1 kg)  11/14/23 219 lb (99.3 kg)     GEN: Well nourished, well developed in no acute distress NECK: No JVD; No carotid bruits CARDIAC: Regular rate and rhythm, no murmurs, rubs, gallops RESPIRATORY:  Clear to auscultation without rales, wheezing or rhonchi  ABDOMEN: Soft, non-tender, non-distended EXTREMITIES:  No edema; No deformity   ASSESSMENT AND PLAN:    CHB s/p Abbott PPM  Normal PPM function See Pace Art report No changes today  Subclinical AF Noted on device. Longest episode < 2 minutes Continue to follow burden for now in setting of acute device placement.  CHA2DS2VASc of at least 3   HTN Stable on current regimen   Disposition:   Follow up with Dr. Nelly Laurence in 3 months  Signed, Graciella Freer, PA-C

## 2023-11-29 ENCOUNTER — Encounter: Payer: Self-pay | Admitting: Student

## 2023-11-29 ENCOUNTER — Ambulatory Visit: Payer: Medicare Other | Attending: Student | Admitting: Student

## 2023-11-29 VITALS — BP 154/88 | HR 76 | Ht 68.0 in | Wt 200.6 lb

## 2023-11-29 DIAGNOSIS — E1159 Type 2 diabetes mellitus with other circulatory complications: Secondary | ICD-10-CM

## 2023-11-29 DIAGNOSIS — I152 Hypertension secondary to endocrine disorders: Secondary | ICD-10-CM | POA: Diagnosis not present

## 2023-11-29 DIAGNOSIS — I480 Paroxysmal atrial fibrillation: Secondary | ICD-10-CM

## 2023-11-29 DIAGNOSIS — I442 Atrioventricular block, complete: Secondary | ICD-10-CM | POA: Diagnosis not present

## 2023-11-29 LAB — CUP PACEART INCLINIC DEVICE CHECK
Battery Remaining Longevity: 129 mo
Battery Voltage: 3.07 V
Brady Statistic RA Percent Paced: 0.07 %
Brady Statistic RV Percent Paced: 99.87 %
Date Time Interrogation Session: 20241220092317
Implantable Lead Connection Status: 753985
Implantable Lead Connection Status: 753985
Implantable Lead Implant Date: 20241205
Implantable Lead Implant Date: 20241205
Implantable Lead Location: 753859
Implantable Lead Location: 753860
Implantable Pulse Generator Implant Date: 20241205
Lead Channel Impedance Value: 475 Ohm
Lead Channel Impedance Value: 537.5 Ohm
Lead Channel Pacing Threshold Amplitude: 0.5 V
Lead Channel Pacing Threshold Amplitude: 0.5 V
Lead Channel Pacing Threshold Amplitude: 0.5 V
Lead Channel Pacing Threshold Pulse Width: 0.5 ms
Lead Channel Pacing Threshold Pulse Width: 0.5 ms
Lead Channel Pacing Threshold Pulse Width: 0.5 ms
Lead Channel Sensing Intrinsic Amplitude: 5 mV
Lead Channel Setting Pacing Amplitude: 0.75 V
Lead Channel Setting Pacing Amplitude: 3.5 V
Lead Channel Setting Pacing Pulse Width: 0.5 ms
Lead Channel Setting Sensing Sensitivity: 4 mV
Pulse Gen Model: 2272
Pulse Gen Serial Number: 8232117

## 2023-11-29 NOTE — Patient Instructions (Signed)
 Medication Instructions:  Your physician recommends that you continue on your current medications as directed. Please refer to the Current Medication list given to you today.  *If you need a refill on your cardiac medications before your next appointment, please call your pharmacy*  Lab Work: None ordered If you have labs (blood work) drawn today and your tests are completely normal, you will receive your results only by: MyChart Message (if you have MyChart) OR A paper copy in the mail If you have any lab test that is abnormal or we need to change your treatment, we will call you to review the results.  Follow-Up: At [facility], you and your health needs are our priority.  As part of our continuing mission to provide you with exceptional heart care, we have created designated Provider Care Teams.  These Care Teams include your primary Cardiologist (physician) and Advanced Practice Providers (APPs -  Physician Assistants and Nurse Practitioners) who all work together to provide you with the care you need, when you need it.  Your next appointment:   As scheduled

## 2024-01-27 ENCOUNTER — Ambulatory Visit: Payer: Medicare Other | Admitting: Family Medicine

## 2024-02-07 ENCOUNTER — Encounter: Payer: Self-pay | Admitting: Family Medicine

## 2024-02-07 ENCOUNTER — Ambulatory Visit (INDEPENDENT_AMBULATORY_CARE_PROVIDER_SITE_OTHER): Payer: Medicare Other | Admitting: Family Medicine

## 2024-02-07 VITALS — BP 122/78 | HR 97 | Temp 98.7°F | Ht 68.0 in | Wt 202.8 lb

## 2024-02-07 DIAGNOSIS — R109 Unspecified abdominal pain: Secondary | ICD-10-CM

## 2024-02-07 DIAGNOSIS — E785 Hyperlipidemia, unspecified: Secondary | ICD-10-CM | POA: Diagnosis not present

## 2024-02-07 DIAGNOSIS — E1159 Type 2 diabetes mellitus with other circulatory complications: Secondary | ICD-10-CM

## 2024-02-07 DIAGNOSIS — E119 Type 2 diabetes mellitus without complications: Secondary | ICD-10-CM | POA: Diagnosis not present

## 2024-02-07 DIAGNOSIS — E1169 Type 2 diabetes mellitus with other specified complication: Secondary | ICD-10-CM

## 2024-02-07 DIAGNOSIS — R351 Nocturia: Secondary | ICD-10-CM

## 2024-02-07 DIAGNOSIS — Z7984 Long term (current) use of oral hypoglycemic drugs: Secondary | ICD-10-CM

## 2024-02-07 DIAGNOSIS — I152 Hypertension secondary to endocrine disorders: Secondary | ICD-10-CM

## 2024-02-07 LAB — URINALYSIS, ROUTINE W REFLEX MICROSCOPIC
Bilirubin, UA: NEGATIVE
Glucose, UA: NEGATIVE
Leukocytes,UA: NEGATIVE
Nitrite, UA: NEGATIVE
Specific Gravity, UA: 1.025 (ref 1.005–1.030)
Urobilinogen, Ur: 0.2 mg/dL (ref 0.2–1.0)
pH, UA: 5 (ref 5.0–7.5)

## 2024-02-07 LAB — MICROSCOPIC EXAMINATION
Bacteria, UA: NONE SEEN
RBC, Urine: >30 /HPF — AB (ref 0–2)
Renal Epithel, UA: NONE SEEN /HPF
WBC, UA: NONE SEEN /HPF (ref 0–5)
Yeast, UA: NONE SEEN

## 2024-02-07 LAB — BAYER DCA HB A1C WAIVED: HB A1C (BAYER DCA - WAIVED): 7.6 % — ABNORMAL HIGH (ref 4.8–5.6)

## 2024-02-07 MED ORDER — JANUMET XR 100-1000 MG PO TB24: 1.0000 | ORAL_TABLET | Freq: Every morning | ORAL | 3 refills | Status: DC

## 2024-02-07 MED ORDER — TAMSULOSIN HCL 0.4 MG PO CAPS: 0.4000 mg | ORAL_CAPSULE | Freq: Every day | ORAL | 3 refills | Status: DC

## 2024-02-07 NOTE — Progress Notes (Signed)
 Subjective: CC:DM PCP: Raliegh Ip, DO OZH:YQMVH R Gary Lopez is a 73 y.o. male presenting to clinic today for:  1. Type 2 Diabetes with hypertension, hyperlipidemia:  Reports compliance with metformin 1000 mg twice daily but he wants to change to daily regimen it is hard to do twice daily dosing and sometimes gives him loose stools.  He reports no nausea, vomiting or decreased p.o. tolerance.  He admits that he typically eats things like jelly biscuits for breakfast.  He is not exercising as much as he had because he still trying to get used to the pacemaker that was placed about a month ago.  He is walking about 2 miles per day.  Compliant with blood pressure regimen and statin.  Reports no hypoglycemic episodes and notes that blood sugars are typically running around 150s most days  Diabetes Health Maintenance Due  Topic Date Due   FOOT EXAM  02/13/2024   HEMOGLOBIN A1C  03/23/2024   OPHTHALMOLOGY EXAM  09/24/2024    Last A1c:  Lab Results  Component Value Date   HGBA1C 6.9 (H) 09/23/2023    2.  Right sided flank pain He reports couple day history of right-sided flank pain.  Denies any dysuria, hematuria, abdominal pain, vomiting or fevers.  He has had some dry heaving however with the pain.  Family history is significant for renal stones and his son   ROS: Per HPI  Not on File Past Medical History:  Diagnosis Date   Abnormal EKG    left bundle branch blcok ekg 06-24-2019 saw dr Diona Browner cardiology for   Arthritis    oa   Heart murmur    History of pneumonia    20 years ago   Hypertension    Type 2 diabetes mellitus (HCC)    Diet controlled    Current Outpatient Medications:    Accu-Chek Softclix Lancets lancets, Test BS up to twice daily Dx E11.65, Disp: 200 each, Rfl: 3   acetaminophen (TYLENOL) 500 MG tablet, Take 500-1,000 mg by mouth every 6 (six) hours as needed for moderate pain (pain score 4-6)., Disp: , Rfl:    amLODipine (NORVASC) 10 MG tablet, Take 1  tablet (10 mg total) by mouth daily., Disp: 90 tablet, Rfl: 3   atorvastatin (LIPITOR) 40 MG tablet, Take 1 tablet (40 mg total) by mouth daily., Disp: 90 tablet, Rfl: 3   blood glucose meter kit and supplies, Dispense based on patient and insurance preference. Use up to 2 times daily as directed. (FOR ICD-10 E11.65, Disp: 1 each, Rfl: 0   glucose blood (ACCU-CHEK GUIDE) test strip, Test BS up to twice daily Dx E11.65, Disp: 200 each, Rfl: 3   losartan (COZAAR) 50 MG tablet, Take 1 tablet (50 mg total) by mouth daily., Disp: 90 tablet, Rfl: 3   metFORMIN (GLUCOPHAGE-XR) 500 MG 24 hr tablet, Take 2 tablets (1,000 mg total) by mouth 2 (two) times daily with a meal., Disp: 360 tablet, Rfl: 3 Social History   Socioeconomic History   Marital status: Married    Spouse name: Burna Mortimer   Number of children: 2   Years of education: Not on file   Highest education level: Not on file  Occupational History   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 They have 2 children - one 2 hours away, one in [place].   Wife is currently sick with cancer   Social Drivers of Health   Financial Resource Strain: Low Risk  (06/21/2023)   Overall Financial Resource Strain (CARDIA)    Difficulty of Paying Living Expenses: Not hard at all  Food Insecurity: No Food Insecurity (11/14/2023)   Hunger Vital Sign    Worried About Running Out of Food in the Last Year: Never true    Ran Out of Food in the Last Year: Never true  Transportation Needs: No Transportation Needs (11/14/2023)   PRAPARE - Administrator, Civil Service (Medical): No    Lack of Transportation (Non-Medical): No  Physical  Activity: Sufficiently Active (06/21/2023)   Exercise Vital Sign    Days of Exercise per Week: 5 days    Minutes of Exercise per Session: 60 min  Stress: No Stress Concern Present (06/21/2023)   [employer] of Occupational Health - Occupational Stress Questionnaire    Feeling of Stress : Not at all  Social Connections: Moderately Isolated (06/21/2023)   Social Connection and Isolation Panel [NHANES]    Frequency of Communication with Friends and Family: More than three times a week    Frequency of Social Gatherings with Friends and Family: More than three times a week    Attends Religious Services: Never    Database administrator or Organizations: No    Attends Banker Meetings: Never    Marital Status: Married  Catering manager Violence: Not At Risk (11/14/2023)   Humiliation, Afraid, Rape, and Kick questionnaire    Fear of Current or Ex-Partner: No    Emotionally Abused: No    Physically Abused: No    Sexually Abused: No   Family History  Problem Relation Age of Onset   Heart disease Mother    COPD Father     Objective: Office vital signs reviewed. BP 122/78   Pulse 97   Temp 98.7 F (37.1 C)   Ht 5\' 8"  (1.727 m)   Wt 202 lb 12.8 oz (92 kg)   SpO2 98%   BMI 30.84 kg/m   Physical Examination:  General: Awake, alert, well nourished, No acute distress HEENT: Sclera white.  Moist mucous membranes.  Poor dentition. Cardio: regular rate and rhythm, S1S2 heard, no murmurs appreciated Pulm: clear to auscultation bilaterally, no wheezes, rhonchi or rales; normal work of breathing on room air GI: soft, non-tender, non-distended, bowel sounds present x4, no hepatomegaly, no splenomegaly, no masses GU: Right-sided CVA tenderness present   Assessment/ Plan: 73 y.o. male   Diabetes mellitus treated with oral medication (HCC) - Plan: CMP14+EGFR, Bayer DCA Hb A1c Waived, Microalbumin / creatinine urine ratio, SitaGLIPtin-MetFORMIN HCl (JANUMET XR) [phone] MG  TB24  Hyperlipidemia due to type 2 diabetes mellitus (HCC) - Plan: CMP14+EGFR  Hypertension associated with diabetes (HCC) - Plan: CMP14+EGFR  Right flank discomfort - Plan: tamsulosin (FLOMAX) 0.4 MG CAPS capsule, Urinalysis, Routine w reflex microscopic, Urine Culture  Nocturia - Plan: tamsulosin (FLOMAX) 0.4 MG CAPS capsule  Sugar not at goal with A1c >7 today.  Having some difficulty tolerating twice daily dosing of metformin some going to switch him over to Janumet [phone] mg and see him back in 3 months for repeat  He will continue statin, blood pressure medication as  prescribed.  Blood pressure was under good control today upon recheck  He is having some right sided flank pain and given urinalysis showing RBCs with some mucus threads I highly suspect he likely has a renal stone.  I did offer him CT renal stone scan to further evaluate size and to look for any hydronephrosis or other kidney complications but he declined this today.  He rather trial Flomax over the weekend and I subsequently prescribed.  We discussed that this may not allow passage of a larger stone.  He will let me know on Monday if he wants to proceed with CT renal stone study.  We discussed red flag signs and symptoms warranting further evaluation in the ER he voiced an understanding  Drew Lips Hulen Skains, DO [facility] [phone]

## 2024-02-08 LAB — CMP14+EGFR
ALT: 38 IU/L (ref 0–44)
AST: 36 IU/L (ref 0–40)
Albumin: 4.5 g/dL (ref 3.8–4.8)
Alkaline Phosphatase: 93 IU/L (ref 44–121)
BUN/Creatinine Ratio: 16 (ref 10–24)
BUN: 16 mg/dL (ref 8–27)
Bilirubin Total: 0.4 mg/dL (ref 0.0–1.2)
CO2: 21 mmol/L (ref 20–29)
Calcium: 9.7 mg/dL (ref 8.6–10.2)
Chloride: 102 mmol/L (ref 96–106)
Creatinine, Ser: 1.03 mg/dL (ref 0.76–1.27)
Globulin, Total: 2.7 g/dL (ref 1.5–4.5)
Glucose: 193 mg/dL — ABNORMAL HIGH (ref 70–99)
Potassium: 4.2 mmol/L (ref 3.5–5.2)
Sodium: 140 mmol/L (ref 134–144)
Total Protein: 7.2 g/dL (ref 6.0–8.5)
eGFR: 77 mL/min/{1.73_m2} (ref >59)

## 2024-02-09 LAB — MICROALBUMIN / CREATININE URINE RATIO
Creatinine, Urine: 326.7 mg/dL
Microalb/Creat Ratio: 36 mg/g{creat} — ABNORMAL HIGH (ref 0–29)
Microalbumin, Urine: 118.8 ug/mL

## 2024-02-09 LAB — URINE CULTURE: Organism ID, Bacteria: NO GROWTH

## 2024-02-14 ENCOUNTER — Encounter: Payer: Self-pay | Admitting: Cardiovascular Disease

## 2024-02-14 ENCOUNTER — Ambulatory Visit (INDEPENDENT_AMBULATORY_CARE_PROVIDER_SITE_OTHER): Payer: Medicare Other

## 2024-02-14 ENCOUNTER — Ambulatory Visit: Payer: Medicare Other | Attending: Cardiovascular Disease | Admitting: Cardiovascular Disease

## 2024-02-14 VITALS — BP 128/76 | HR 108 | Ht 68.0 in | Wt 200.0 lb

## 2024-02-14 DIAGNOSIS — I442 Atrioventricular block, complete: Secondary | ICD-10-CM | POA: Diagnosis not present

## 2024-02-14 LAB — CUP PACEART INCLINIC DEVICE CHECK
Battery Remaining Longevity: 126 mo
Battery Voltage: 3.02 V
Brady Statistic RA Percent Paced: 0.56 %
Brady Statistic RV Percent Paced: 69 %
Date Time Interrogation Session: 20250307092911
Implantable Lead Connection Status: 753985
Implantable Lead Connection Status: 753985
Implantable Lead Implant Date: 20241205
Implantable Lead Implant Date: 20241205
Implantable Lead Location: 753859
Implantable Lead Location: 753860
Implantable Pulse Generator Implant Date: 20241205
Lead Channel Impedance Value: 475 Ohm
Lead Channel Impedance Value: 487.5 Ohm
Lead Channel Pacing Threshold Amplitude: 0.5 V
Lead Channel Pacing Threshold Amplitude: 0.5 V
Lead Channel Pacing Threshold Amplitude: 0.75 V
Lead Channel Pacing Threshold Amplitude: 0.75 V
Lead Channel Pacing Threshold Pulse Width: 0.5 ms
Lead Channel Pacing Threshold Pulse Width: 0.5 ms
Lead Channel Pacing Threshold Pulse Width: 0.5 ms
Lead Channel Pacing Threshold Pulse Width: 0.5 ms
Lead Channel Sensing Intrinsic Amplitude: 12 mV
Lead Channel Sensing Intrinsic Amplitude: 5 mV
Lead Channel Setting Pacing Amplitude: 1 V
Lead Channel Setting Pacing Amplitude: 2 V
Lead Channel Setting Pacing Pulse Width: 0.5 ms
Lead Channel Setting Sensing Sensitivity: 4 mV
Pulse Gen Model: 2272
Pulse Gen Serial Number: 8232117

## 2024-02-14 NOTE — Patient Instructions (Signed)

## 2024-02-14 NOTE — Progress Notes (Signed)
  Electrophysiology Office Note:    Date:  02/14/2024   ID:  Gary Lopez, DOB 01-31-1951, MRN [id number]  PCP:  Raliegh Ip, DO   [facility] Providers Cardiologist:  None Electrophysiologist:  Maurice Small, MD     Referring MD: Raliegh Ip, DO   History of Present Illness:    Gary Lopez is a 73 y.o. male with a medical history significant for complete heart block status post Abbott pacemaker, hypertension, hyperlipidemia, diabetes 2 who presents for pacemaker follow-up,.     He has an Abbott dual-chamber pacemaker implanted in December 2024 for complete heart block.       Today, he reports that he is doing Psychologist, clinical well.  He is a little nervous today about seeing the doctor. he has no device related complaints -- no new tenderness, drainage, redness.   EKGs/Labs/Other Studies Reviewed Today:     Echocardiogram:  TTE December 2024 EF 70 to 75%.  Patient was in complete heart block at the time.  Mild concentric LVH.  Left atrium is severely dilated.   EKG:         Physical Exam:    VS:  BP 128/76 (BP Location: Left Arm, Patient Position: Sitting, Cuff Size: Normal)   Pulse (!) 108   Ht 5\' 8"  (1.727 m)   Wt 200 lb (90.7 kg)   SpO2 96%   BMI 30.41 kg/m     Wt Readings from Last 3 Encounters:  02/14/24 200 lb (90.7 kg)  02/07/24 202 lb 12.8 oz (92 kg)  11/29/23 200 lb 9.6 oz (91 kg)     GEN: Well nourished, well developed in no acute distress CARDIAC: RRR, no murmurs, rubs, gallops The device site is normal -- no tenderness, edema, drainage, redness, threatened erosion.  RESPIRATORY:  Normal work of breathing MUSCULOSKELETAL: no edema    ASSESSMENT & PLAN:     Complete heart block Abbott dual-chamber pacemaker I reviewed today's device interrogation.  See Paceart for details Device is functioning normally Patient is not pacemaker dependent today with 2-1 AV block and sinus tachycardia  Subclinical atrial  fibrillation No AF detected on interrogation today Brief atrial high rate episodes appear to be largely T wave oversensing -- programming adjusted today   Hypertension BP controlled today      Signed, Maurice Small, MD  02/14/2024 9:28 AM    [facility]

## 2024-02-16 LAB — CUP PACEART REMOTE DEVICE CHECK
Battery Remaining Longevity: 104 mo
Battery Remaining Percentage: 95.5 %
Battery Voltage: 3.02 V
Brady Statistic AP VP Percent: 1 %
Brady Statistic AP VS Percent: 1 %
Brady Statistic AS VP Percent: 68 %
Brady Statistic AS VS Percent: 31 %
Brady Statistic RA Percent Paced: 1 %
Brady Statistic RV Percent Paced: 69 %
Date Time Interrogation Session: 20250307025330
Implantable Lead Connection Status: 753985
Implantable Lead Connection Status: 753985
Implantable Lead Implant Date: 20241205
Implantable Lead Implant Date: 20241205
Implantable Lead Location: 753859
Implantable Lead Location: 753860
Implantable Pulse Generator Implant Date: 20241205
Lead Channel Impedance Value: 460 Ohm
Lead Channel Impedance Value: 480 Ohm
Lead Channel Pacing Threshold Amplitude: 0.5 V
Lead Channel Pacing Threshold Amplitude: 0.75 V
Lead Channel Pacing Threshold Pulse Width: 0.5 ms
Lead Channel Pacing Threshold Pulse Width: 0.5 ms
Lead Channel Sensing Intrinsic Amplitude: 12 mV
Lead Channel Sensing Intrinsic Amplitude: 5 mV
Lead Channel Setting Pacing Amplitude: 1 V
Lead Channel Setting Pacing Amplitude: 3.5 V
Lead Channel Setting Pacing Pulse Width: 0.5 ms
Lead Channel Setting Sensing Sensitivity: 4 mV
Pulse Gen Model: 2272
Pulse Gen Serial Number: 8232117

## 2024-02-25 ENCOUNTER — Encounter: Payer: Self-pay | Admitting: Cardiovascular Disease

## 2024-03-17 NOTE — Addendum Note (Signed)
 Addended by: Elease Etienne A on: 03/17/2024 12:04 PM   Modules accepted: Orders

## 2024-03-17 NOTE — Progress Notes (Signed)
 Remote pacemaker transmission.

## 2024-03-19 LAB — HM DIABETES EYE EXAM

## 2024-04-06 ENCOUNTER — Encounter: Payer: Self-pay | Admitting: Family Medicine

## 2024-05-13 NOTE — Progress Notes (Deleted)
 Subjective: CC:DM PCP: Eliodoro Guerin, DO [id number] R Gary Lopez is a 73 y.o. male presenting to clinic today for:  1. Type 2 Diabetes with hypertension, hyperlipidemia:  Glucometer:***.   High at home: ***; Low at home: ***, Taking medication(s): Janumet  added last OV,.  Diabetes Health Maintenance Due  Topic Date Due   FOOT EXAM  02/13/2024   HEMOGLOBIN A1C  08/06/2024   OPHTHALMOLOGY EXAM  03/19/2025    Last A1c:  Lab Results  Component Value Date   HGBA1C 7.6 (H) 02/07/2024    ROS: ***dizziness, LOC, polyuria, polydipsia, unintended weight loss/gain, foot ulcerations, numbness or tingling in extremities, shortness of breath or chest pain.   ROS: Per HPI  No Known Allergies Past Medical History:  Diagnosis Date   Abnormal EKG    left bundle branch blcok ekg 06-24-2019 saw dr Londa Rival cardiology for   Arthritis    oa   Heart murmur    History of pneumonia    20 years ago   Hypertension    Type 2 diabetes mellitus (HCC)    Diet controlled    Current Outpatient Medications:    Accu-Chek Softclix Lancets lancets, Test BS up to twice daily Dx E11.65, Disp: 200 each, Rfl: 3   acetaminophen  (TYLENOL ) 500 MG tablet, Take 500-1,000 mg by mouth every 6 (six) hours as needed for moderate pain (pain score 4-6)., Disp: , Rfl:    amLODipine  (NORVASC ) 10 MG tablet, Take 1 tablet (10 mg total) by mouth daily., Disp: 90 tablet, Rfl: 3   atorvastatin  (LIPITOR) 40 MG tablet, Take 1 tablet (40 mg total) by mouth daily., Disp: 90 tablet, Rfl: 3   blood glucose meter kit and supplies, Dispense based on patient and insurance preference. Use up to 2 times daily as directed. (FOR ICD-10 E11.65, Disp: 1 each, Rfl: 0   glucose blood (ACCU-CHEK GUIDE) test strip, Test BS up to twice daily Dx E11.65, Disp: 200 each, Rfl: 3   losartan  (COZAAR ) 50 MG tablet, Take 1 tablet (50 mg total) by mouth daily., Disp: 90 tablet, Rfl: 3   SitaGLIPtin-MetFORMIN  HCl (JANUMET  XR) [phone] MG TB24, Take  1 tablet by mouth in the morning. To replace metformin , Disp: 90 tablet, Rfl: 3   tamsulosin  (FLOMAX ) 0.4 MG CAPS capsule, Take 1 capsule (0.4 mg total) by mouth daily after supper. 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 They have 2 children - one 2 hours away, one in [place] .   Wife is currently sick with cancer   Social Drivers of Health   Financial Resource Strain: Low Risk  (06/21/2023)   Overall Financial Resource Strain (CARDIA)    Difficulty of Paying Living Expenses: Not hard at all  Food Insecurity: No Food Insecurity (11/14/2023)   Hunger Vital Sign    Worried About Running Out  of Food in the Last Year: Never true    Ran Out of Food in the Last Year: Never true  Transportation Needs: No Transportation Needs (11/14/2023)   PRAPARE - Administrator, Civil Service (Medical): No    Lack of Transportation (Non-Medical): No  Physical Activity: Sufficiently Active (06/21/2023)   Exercise Vital Sign    Days of Exercise per Week: 5 days    Minutes of Exercise per Session: 60 min  Stress: No Stress Concern Present (06/21/2023)   [employer] of Occupational Health - Occupational Stress Questionnaire    Feeling of Stress : Not at all  Social Connections: Moderately Isolated (06/21/2023)   Social Connection and Isolation Panel [NHANES]    Frequency of Communication with Friends and Family:  More than three times a week    Frequency of Social Gatherings with Friends and Family: More than three times a week    Attends Religious Services: Never    Database administrator or Organizations: No    Attends Banker Meetings: Never    Marital Status: Married  Catering manager Violence: Not At Risk (11/14/2023)   Humiliation, Afraid, Rape, and Kick questionnaire    Fear of Current or Ex-Partner: No    Emotionally Abused: No    Physically Abused: No    Sexually Abused: No   Family History  Problem Relation Age of Onset   Heart disease Mother    COPD Father     Objective: Office vital signs reviewed. There were no vitals taken for this visit.  Physical Examination:  General: Awake, alert, *** nourished, No acute distress HEENT: Normal    Neck: No masses palpated. No lymphadenopathy    Ears: Tympanic membranes intact, normal light reflex, no erythema, no bulging    Eyes: PERRLA, extraocular membranes intact, sclera ***    Nose: nasal turbinates moist, *** nasal discharge    Throat: moist mucus membranes, no erythema, *** tonsillar exudate.  Airway is patent Cardio: regular rate and rhythm, S1S2 heard, no murmurs appreciated Pulm: clear to auscultation bilaterally, no wheezes, rhonchi or rales; normal work of breathing on room air GI: soft, non-tender, non-distended, bowel sounds present x4, no hepatomegaly, no splenomegaly, no masses GU: external vaginal tissue ***, cervix ***, *** punctate lesions on cervix appreciated, *** discharge from cervical os, *** bleeding, *** cervical motion tenderness, *** abdominal/ adnexal masses Extremities: warm, well perfused, No edema, cyanosis or clubbing; +*** pulses bilaterally MSK: *** gait and *** station Skin: dry; intact; no rashes or lesions Neuro: *** Strength and light touch sensation grossly intact, *** DTRs ***/4  Diabetic Foot Exam - Simple   No data filed      Assessment/ Plan: 73 y.o. male   Diabetes  mellitus treated with oral medication (HCC)  Hyperlipidemia due to type 2 diabetes mellitus (HCC)  Hypertension associated with diabetes (HCC)   ***  Rosemae Mcquown Bambi Bonine, DO [facility] [phone]

## 2024-05-15 ENCOUNTER — Ambulatory Visit: Payer: Medicare Other | Admitting: Family Medicine

## 2024-05-15 ENCOUNTER — Ambulatory Visit (INDEPENDENT_AMBULATORY_CARE_PROVIDER_SITE_OTHER): Payer: Medicare Other

## 2024-05-15 DIAGNOSIS — I442 Atrioventricular block, complete: Secondary | ICD-10-CM | POA: Diagnosis not present

## 2024-05-15 DIAGNOSIS — E1169 Type 2 diabetes mellitus with other specified complication: Secondary | ICD-10-CM

## 2024-05-15 DIAGNOSIS — E119 Type 2 diabetes mellitus without complications: Secondary | ICD-10-CM

## 2024-05-15 DIAGNOSIS — E1159 Type 2 diabetes mellitus with other circulatory complications: Secondary | ICD-10-CM

## 2024-05-18 LAB — CUP PACEART REMOTE DEVICE CHECK
Battery Remaining Longevity: 120 mo
Battery Remaining Percentage: 95.5 %
Battery Voltage: 3.01 V
Brady Statistic AP VP Percent: 1 %
Brady Statistic AP VS Percent: 1 %
Brady Statistic AS VP Percent: 41 %
Brady Statistic AS VS Percent: 58 %
Brady Statistic RA Percent Paced: 1.3 %
Brady Statistic RV Percent Paced: 41 %
Date Time Interrogation Session: 20250607143055
Implantable Lead Connection Status: 753985
Implantable Lead Connection Status: 753985
Implantable Lead Implant Date: 20241205
Implantable Lead Implant Date: 20241205
Implantable Lead Location: 753859
Implantable Lead Location: 753860
Implantable Pulse Generator Implant Date: 20241205
Lead Channel Impedance Value: 440 Ohm
Lead Channel Impedance Value: 460 Ohm
Lead Channel Pacing Threshold Amplitude: 0.5 V
Lead Channel Pacing Threshold Amplitude: 0.875 V
Lead Channel Pacing Threshold Pulse Width: 0.5 ms
Lead Channel Pacing Threshold Pulse Width: 0.5 ms
Lead Channel Sensing Intrinsic Amplitude: 12 mV
Lead Channel Sensing Intrinsic Amplitude: 5 mV
Lead Channel Setting Pacing Amplitude: 1.125
Lead Channel Setting Pacing Amplitude: 2 V
Lead Channel Setting Pacing Pulse Width: 0.5 ms
Lead Channel Setting Sensing Sensitivity: 4 mV
Pulse Gen Model: 2272
Pulse Gen Serial Number: 8232117

## 2024-05-18 NOTE — Progress Notes (Unsigned)
 Subjective: CC:DM PCP: Eliodoro Guerin, DO Gary Lopez is a 73 y.o. male presenting to clinic today for:  1. Type 2 Diabetes with hypertension, hyperlipidemia:  Glucometer:***.   High at home: ***; Low at home: ***, Taking medication(s): ***,.  Diabetes Health Maintenance Due  Topic Date Due   FOOT EXAM  02/13/2024   HEMOGLOBIN A1C  08/06/2024   OPHTHALMOLOGY EXAM  03/19/2025    Last A1c:  Lab Results  Component Value Date   HGBA1C 7.6 (H) 02/07/2024    ROS: ***dizziness, LOC, polyuria, polydipsia, unintended weight loss/gain, foot ulcerations, numbness or tingling in extremities, shortness of breath or chest pain.   ROS: Per HPI  No Known Allergies Past Medical History:  Diagnosis Date   Abnormal EKG    left bundle branch blcok ekg 06-24-2019 saw dr Londa Rival cardiology for   Arthritis    oa   Heart murmur    History of pneumonia    20 years ago   Hypertension    Type 2 diabetes mellitus (HCC)    Diet controlled    Current Outpatient Medications:    Accu-Chek Softclix Lancets lancets, Test BS up to twice daily Dx E11.65, Disp: 200 each, Rfl: 3   acetaminophen  (TYLENOL ) 500 MG tablet, Take 500-1,000 mg by mouth every 6 (six) hours as needed for moderate pain (pain score 4-6)., Disp: , Rfl:    amLODipine  (NORVASC ) 10 MG tablet, Take 1 tablet (10 mg total) by mouth daily., Disp: 90 tablet, Rfl: 3   atorvastatin  (LIPITOR) 40 MG tablet, Take 1 tablet (40 mg total) by mouth daily., Disp: 90 tablet, Rfl: 3   blood glucose meter kit and supplies, Dispense based on patient and insurance preference. Use up to 2 times daily as directed. (FOR ICD-10 E11.65, Disp: 1 each, Rfl: 0   glucose blood (ACCU-CHEK GUIDE) test strip, Test BS up to twice daily Dx E11.65, Disp: 200 each, Rfl: 3   losartan  (COZAAR ) 50 MG tablet, Take 1 tablet (50 mg total) by mouth daily., Disp: 90 tablet, Rfl: 3   SitaGLIPtin-MetFORMIN  HCl (JANUMET  XR) [phone] MG TB24, Take 1 tablet by mouth  in the morning. To replace metformin , Disp: 90 tablet, Rfl: 3   tamsulosin  (FLOMAX ) 0.4 MG CAPS capsule, Take 1 capsule (0.4 mg total) by mouth daily after supper. 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 They have 2 children - one 2 hours away, one in [place] .   Wife is currently sick with cancer   Social Drivers of Health   Financial Resource Strain: Low Risk  (06/21/2023)   Overall Financial Resource Strain (CARDIA)    Difficulty of Paying Living Expenses: Not hard at all  Food Insecurity: No Food Insecurity (11/14/2023)   Hunger Vital Sign    Worried About Programme researcher, broadcasting/film/video in  the Last Year: Never true    Ran Out of Food in the Last Year: Never true  Transportation Needs: No Transportation Needs (11/14/2023)   PRAPARE - Administrator, Civil Service (Medical): No    Lack of Transportation (Non-Medical): No  Physical Activity: Sufficiently Active (06/21/2023)   Exercise Vital Sign    Days of Exercise per Week: 5 days    Minutes of Exercise per Session: 60 min  Stress: No Stress Concern Present (06/21/2023)   [employer] of Occupational Health - Occupational Stress Questionnaire    Feeling of Stress : Not at all  Social Connections: Moderately Isolated (06/21/2023)   Social Connection and Isolation Panel [NHANES]    Frequency of Communication with Friends and Family: More than three  times a week    Frequency of Social Gatherings with Friends and Family: More than three times a week    Attends Religious Services: Never    Database administrator or Organizations: No    Attends Banker Meetings: Never    Marital Status: Married  Catering manager Violence: Not At Risk (11/14/2023)   Humiliation, Afraid, Rape, and Kick questionnaire    Fear of Current or Ex-Partner: No    Emotionally Abused: No    Physically Abused: No    Sexually Abused: No   Family History  Problem Relation Age of Onset   Heart disease Mother    COPD Father     Objective: Office vital signs reviewed. There were no vitals taken for this visit.  Physical Examination:  General: Awake, alert, *** nourished, No acute distress HEENT: Normal    Neck: No masses palpated. No lymphadenopathy    Ears: Tympanic membranes intact, normal light reflex, no erythema, no bulging    Eyes: PERRLA, extraocular membranes intact, sclera ***    Nose: nasal turbinates moist, *** nasal discharge    Throat: moist mucus membranes, no erythema, *** tonsillar exudate.  Airway is patent Cardio: regular rate and rhythm, S1S2 heard, no murmurs appreciated Pulm: clear to auscultation bilaterally, no wheezes, rhonchi or rales; normal work of breathing on room air GI: soft, non-tender, non-distended, bowel sounds present x4, no hepatomegaly, no splenomegaly, no masses GU: external vaginal tissue ***, cervix ***, *** punctate lesions on cervix appreciated, *** discharge from cervical os, *** bleeding, *** cervical motion tenderness, *** abdominal/ adnexal masses Extremities: warm, well perfused, No edema, cyanosis or clubbing; +*** pulses bilaterally MSK: *** gait and *** station Skin: dry; intact; no rashes or lesions Neuro: *** Strength and light touch sensation grossly intact, *** DTRs ***/4  Diabetic Foot Exam - Simple   No data filed      Assessment/ Plan: 73 y.o. male   Diabetes mellitus treated with  oral medication (HCC)  Hyperlipidemia due to type 2 diabetes mellitus (HCC)  Hypertension associated with diabetes (HCC)   ***  Kazoua Gossen Bambi Bonine, DO [facility] [phone]

## 2024-05-19 ENCOUNTER — Ambulatory Visit: Payer: Self-pay | Admitting: Cardiovascular Disease

## 2024-05-19 ENCOUNTER — Encounter: Payer: Self-pay | Admitting: Family Medicine

## 2024-05-19 ENCOUNTER — Ambulatory Visit (INDEPENDENT_AMBULATORY_CARE_PROVIDER_SITE_OTHER): Admitting: Family Medicine

## 2024-05-19 VITALS — BP 123/70 | HR 95 | Temp 97.9°F | Ht 68.0 in | Wt 201.0 lb

## 2024-05-19 DIAGNOSIS — B351 Tinea unguium: Secondary | ICD-10-CM

## 2024-05-19 DIAGNOSIS — Z7984 Long term (current) use of oral hypoglycemic drugs: Secondary | ICD-10-CM

## 2024-05-19 DIAGNOSIS — E1159 Type 2 diabetes mellitus with other circulatory complications: Secondary | ICD-10-CM | POA: Diagnosis not present

## 2024-05-19 DIAGNOSIS — E1169 Type 2 diabetes mellitus with other specified complication: Secondary | ICD-10-CM

## 2024-05-19 DIAGNOSIS — E785 Hyperlipidemia, unspecified: Secondary | ICD-10-CM

## 2024-05-19 DIAGNOSIS — I152 Hypertension secondary to endocrine disorders: Secondary | ICD-10-CM | POA: Diagnosis not present

## 2024-05-19 DIAGNOSIS — E119 Type 2 diabetes mellitus without complications: Secondary | ICD-10-CM

## 2024-05-19 LAB — BAYER DCA HB A1C WAIVED: HB A1C (BAYER DCA - WAIVED): 7.5 % — ABNORMAL HIGH (ref 4.8–5.6)

## 2024-05-19 MED ORDER — LOSARTAN POTASSIUM 50 MG PO TABS: 50.0000 mg | ORAL_TABLET | Freq: Every day | ORAL | 3 refills | Status: DC

## 2024-05-19 MED ORDER — ATORVASTATIN CALCIUM 40 MG PO TABS: 40.0000 mg | ORAL_TABLET | Freq: Every day | ORAL | 3 refills | Status: DC

## 2024-05-19 MED ORDER — AMLODIPINE BESYLATE 10 MG PO TABS
10.0000 mg | ORAL_TABLET | Freq: Every day | ORAL | 3 refills | Status: DC
Start: 2024-05-19 — End: 2024-08-25

## 2024-05-19 MED ORDER — TERBINAFINE HCL 250 MG PO TABS: 250.0000 mg | ORAL_TABLET | Freq: Every day | ORAL | 0 refills | Status: AC

## 2024-05-19 NOTE — Patient Instructions (Signed)

## 2024-05-20 ENCOUNTER — Ambulatory Visit: Payer: Self-pay | Admitting: Family Medicine

## 2024-05-20 LAB — LIPID PANEL
Chol/HDL Ratio: 1.3 ratio (ref 0.0–5.0)
Cholesterol, Total: 115 mg/dL (ref 100–199)
HDL: 92 mg/dL (ref >39)
LDL Chol Calc (NIH): 7 mg/dL (ref 0–99)
Triglycerides: 84 mg/dL (ref 0–149)
VLDL Cholesterol Cal: 16 mg/dL (ref 5–40)

## 2024-06-01 ENCOUNTER — Encounter: Payer: Medicare Other | Admitting: Family Medicine

## 2024-06-23 ENCOUNTER — Ambulatory Visit: Payer: Medicare Other

## 2024-06-23 VITALS — BP 123/70 | HR 95 | Ht 68.0 in | Wt 201.0 lb

## 2024-06-23 DIAGNOSIS — Z0000 Encounter for general adult medical examination without abnormal findings: Secondary | ICD-10-CM | POA: Diagnosis not present

## 2024-06-23 NOTE — Progress Notes (Signed)
 Subjective:   Gary Lopez is a 73 y.o. who presents for a Medicare Wellness preventive visit.  As a reminder, Annual Wellness Visits don't include a physical exam, and some assessments may be limited, especially if this visit is performed virtually. We may recommend an in-person follow-up visit with your provider if needed.  Visit Complete: Virtual I connected with  Gary Lopez on 06/23/24 by a audio enabled telemedicine application and verified that I am speaking with the correct person using two identifiers.  Patient Location: Home  Provider Location: Home Office  I discussed the limitations of evaluation and management by telemedicine. The patient expressed understanding and agreed to proceed.  Vital Signs: Because this visit was a virtual/telehealth visit, some criteria may be missing or patient reported. Any vitals not documented were not able to be obtained and vitals that have been documented are patient reported.  VideoDeclined- This patient declined Librarian, academic. Therefore the visit was completed with audio only.  Persons Participating in Visit: Patient.  AWV Questionnaire: No: Patient Medicare AWV questionnaire was not completed prior to this visit.  Cardiac Risk Factors include: advanced age (>70men, >53 women);diabetes mellitus;dyslipidemia;hypertension;male gender;obesity (BMI >30kg/m2)     Objective:    Today's Vitals   06/23/24 0900  BP: 123/70  Pulse: 95  Weight: 201 lb (91.2 kg)  Height: 5' 8 (1.727 m)   Body mass index is 30.56 kg/m.     06/23/2024    8:54 AM 11/14/2023    9:30 PM 11/14/2023   11:03 AM 06/21/2023    3:34 PM 06/19/2022    9:09 AM 06/16/2021   10:19 AM 08/27/2019    6:00 PM  Advanced Directives  Does Patient Have a Medical Advance Directive? No No No Yes No No No  Type of Theme park manager;Living will     Copy of Healthcare Power of Attorney in Chart?    No - copy  requested     Would patient like information on creating a medical advance directive?  No - Patient declined No - Patient declined  No - Patient declined No - Patient declined No - Patient declined    Current Medications (verified) Outpatient Encounter Medications as of 06/23/2024  Medication Sig   Accu-Chek Softclix Lancets lancets Test BS up to twice daily Dx E11.65   acetaminophen  (TYLENOL ) 500 MG tablet Take 500-1,000 mg by mouth every 6 (six) hours as needed for moderate pain (pain score 4-6).   amLODipine  (NORVASC ) 10 MG tablet Take 1 tablet (10 mg total) by mouth daily.   atorvastatin  (LIPITOR) 40 MG tablet Take 1 tablet (40 mg total) by mouth daily.   blood glucose meter kit and supplies Dispense based on patient and insurance preference. Use up to 2 times daily as directed. (FOR ICD-10 E11.65   glucose blood (ACCU-CHEK GUIDE) test strip Test BS up to twice daily Dx E11.65   losartan  (COZAAR ) 50 MG tablet Take 1 tablet (50 mg total) by mouth daily.   SitaGLIPtin-MetFORMIN  HCl (JANUMET  XR) [phone] MG TB24 Take 1 tablet by mouth in the morning. To replace metformin    tamsulosin  (FLOMAX ) 0.4 MG CAPS capsule Take 1 capsule (0.4 mg total) by mouth daily after supper. For prostate/ kidney stones   terbinafine  (LAMISIL ) 250 MG tablet Take 1 tablet (250 mg total) by mouth daily. X3 months. Lab needed in 1.19months   No facility-administered encounter medications on file as of 06/23/2024.    Allergies (  verified) Patient has no known allergies.   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   Procedure: TOTAL HIP ARTHROPLASTY ANTERIOR APPROACH;  Surgeon: Fidel Rogue, MD;  Location: [facility];  Service: Orthopedics;  Laterality: Left;   Family History  Problem Relation Age of Onset   Heart disease Mother    COPD Father    Social History   Socioeconomic History   Marital status: Married    Spouse name: Apolinar   Number of children: 2   Years of education: Not on file   Highest education level: Not on file  Occupational History   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 They have 2 children - one 2 hours away, one in [place] .   Wife is currently sick with cancer   Social Drivers of Health   Financial Resource Strain: Low Risk  (06/23/2024)   Overall Financial Resource Strain (CARDIA)    Difficulty of Paying Living Expenses: Not hard at all  Food Insecurity: No Food Insecurity (06/23/2024)   Hunger Vital Sign    Worried About Running Out of Food in the Last Year: Never true    Ran Out of Food in the Last Year: Never true  Transportation Needs: No Transportation Needs (06/23/2024)   PRAPARE - Administrator, Civil Service (Medical): No    Lack of Transportation (Non-Medical): No  Physical Activity: Sufficiently Active (06/23/2024)   Exercise Vital Sign    Days of Exercise per Week: 5 days    Minutes of Exercise per Session: 60 min  Stress: No Stress Concern Present (06/23/2024)   [employer] of Occupational Health - Occupational Stress Questionnaire    Feeling of Stress: Not at all   Social Connections: Moderately Isolated (06/23/2024)   Social Connection and Isolation Panel    Frequency of Communication with Friends and Family: More than three times a week    Frequency of Social Gatherings with Friends and Family: More than three times a week    Attends Religious Services: Never    Database administrator or Organizations: No    Attends Engineer, structural: Never    Marital Status: Married    Tobacco Counseling Counseling given: Yes    Clinical Intake:  Pre-visit preparation completed: Yes  Pain : No/denies pain     BMI - recorded: 30.56 Nutritional Status: BMI > 30  Obese Nutritional Risks: None Diabetes: Yes CBG done?: No  Lab Results  Component Value Date   HGBA1C 7.5 (H) 05/19/2024   HGBA1C 7.6 (H) 02/07/2024   HGBA1C 6.9 (H) 09/23/2023     How often do you need to have someone help you when you read instructions, pamphlets, or other written materials from your doctor or pharmacy?: 1 - Never  Interpreter Needed?: No  Information entered by :: alia t/cma   Activities of Daily Living     06/23/2024    8:47 AM 11/14/2023    9:30 PM  In your present state of health, do you have any difficulty performing the following activities:  Hearing? 0 0  Vision? 0 0  Difficulty concentrating or making decisions? 0 0  Walking or climbing stairs? 0   Dressing or bathing? 0   Doing errands, shopping? 0 0  Preparing Food and eating ? N   Using the Toilet? N   In the past six months, have you accidently leaked urine? N   Do you have problems with loss of bowel control? N   Managing your Medications? N   Managing your Finances? N   Housekeeping or managing your Housekeeping? N     Patient Care Team: Jolinda Norene HERO, DO as PCP - General (Family Medicine) Mealor, Eulas BRAVO, MD as PCP - Electrophysiology (Cardiology) Fidel Rogue, MD as Consulting Physician (Orthopedic Surgery)  I have updated your Care Teams any recent Medical  Services you may have received from other providers in the past year.     Assessment:   This is a routine wellness examination for Gary Lopez.  Hearing/Vision screen Hearing Screening - Comments:: Pt denies hearing dif  Vision Screening - Comments:: Pt wear glasses denies vision dif/pt goes [facility] in [place]/its been checked in awhile   Goals Addressed             This Visit's Progress    Exercise 3x per week (30 min per time)   On track    Continue walking daily 06/19/22 - usually walking 5 miles 5-6 days per week on [address]       Depression Screen     06/23/2024    8:52 AM 05/19/2024    8:05 AM 02/07/2024    3:23 PM 09/23/2023    8:07 AM 06/21/2023    3:33 PM 02/13/2023    8:12 AM 11/12/2022    8:47 AM  PHQ 2/9 Scores  PHQ - 2 Score 0 0 0 0 0 0 0  PHQ- 9 Score 0 0 0 0  0     Fall Risk     06/23/2024    8:44 AM 05/19/2024    8:05 AM 02/07/2024    3:23 PM 09/23/2023    8:07 AM 06/21/2023    3:31 PM  Fall Risk   Falls in the past year? 0 0 0 0 0  Number falls in past yr: 0 0 0 0 0  Injury with Fall? 0 0 0 0 0  Risk for fall due to : No Fall Risks No Fall Risks No Fall Risks No Fall Risks No Fall Risks  Follow up Falls evaluation completed Falls evaluation completed Falls evaluation completed Education provided Falls prevention discussed    MEDICARE RISK AT HOME:  Medicare Risk at Home Any stairs in or around the home?: Yes If so, are there any without handrails?: Yes Home free of loose throw rugs in walkways, pet beds, electrical cords, etc?: Yes Adequate lighting in your home to reduce risk of falls?: Yes Life alert?: No  Use of a cane, walker or w/c?: No Grab bars in the bathroom?: Yes Shower chair or bench in shower?: Yes Elevated toilet seat or a handicapped toilet?: No  TIMED UP AND GO:  Was the test performed?  no  Cognitive Function: 6CIT completed        06/23/2024    8:53 AM 06/21/2023    3:35 PM 06/19/2022    9:10 AM 06/16/2021   10:19 AM   6CIT Screen  What Year? 0 points 0 points 0 points 0 points  What month? 0 points 0 points 0 points 0 points  What time? 0 points 0 points 0 points 0 points  Count back from 20 0 points 0 points 0 points 0 points  Months in reverse 4 points 0 points 4 points 0 points  Repeat phrase 10 points 0 points 10 points 0 points  Total Score 14 points 0 points 14 points 0 points    Immunizations Immunization History  Administered Date(s) Administered   Fluad Quad(high Dose 65+) 10/24/2019, 10/11/2021, 11/12/2022   Fluad Trivalent(High Dose 65+) 09/23/2023   Moderna Sars-Covid-2 Vaccination 03/17/2020, 04/14/2020   Pneumococcal Conjugate-13 11/24/2018   Pneumococcal Polysaccharide-23 02/17/2021   Tdap 03/22/2006, 11/24/2018    Screening Tests Health Maintenance  Topic Date Due   Zoster Vaccines- Shingrix (1 of 2) Never done   Medicare Annual Wellness (AWV)  06/20/2024   COVID-19 Vaccine (3 - 2024-25 season) 07/09/2025 (Originally 08/11/2023)   INFLUENZA VACCINE  07/10/2024   HEMOGLOBIN A1C  11/18/2024   Diabetic kidney evaluation - eGFR measurement  02/06/2025   Diabetic kidney evaluation - Urine ACR  02/06/2025   OPHTHALMOLOGY EXAM  03/19/2025   FOOT EXAM  05/19/2025   DTaP/Tdap/Td (3 - Td or Tdap) 11/24/2028   Colonoscopy  09/18/2032   Pneumococcal Vaccine: 50+ Years  Completed   Hepatitis C Screening  Completed   Hepatitis B Vaccines  Aged Out   HPV VACCINES  Aged Out   Meningococcal B Vaccine  Aged Out    Health Maintenance  Health Maintenance Due  Topic Date Due   Zoster Vaccines- Shingrix (1 of 2) Never done   [employer] Annual Wellness (AWV)  06/20/2024   Health Maintenance Items Addressed: See Nurse Notes at the end of this note  Additional Screening:  Vision Screening: Recommended annual ophthalmology exams for early detection of glaucoma and other disorders of the eye. Would you like a referral to an eye doctor? No    Dental Screening: Recommended annual dental  exams for proper oral hygiene  Community Resource Referral / Chronic Care Management: CRR required this visit?  No   CCM required this visit?  No   Plan:    I have personally reviewed and noted the following in the patient's chart:   Medical and social history Use of alcohol , tobacco or illicit drugs  Current medications and supplements including opioid prescriptions. Patient is not currently taking opioid prescriptions. Functional ability and status Nutritional status Physical activity Advanced directives List of other physicians Hospitalizations, surgeries, and ER visits in previous 12 months Vitals Screenings to include cognitive, depression, and falls Referrals and appointments  In addition, I have reviewed and discussed with patient certain preventive protocols, quality metrics, and best practice recommendations. A written personalized care plan for preventive services as well as general preventive health recommendations were provided to patient.   Gary Lopez, CMA   06/23/2024   After Visit Summary: (MyChart) Due to this being a telephonic visit, the after visit  summary with patients personalized plan was offered to patient via MyChart   Notes: Nothing significant to report at this time.

## 2024-06-23 NOTE — Patient Instructions (Signed)
 Mr. Gary Lopez , Thank you for taking time out of your busy schedule to complete your Annual Wellness Visit with me. I enjoyed our conversation and look forward to speaking with you again next year. I, as well as your care team,  appreciate your ongoing commitment to your health goals. Please review the following plan we discussed and let me know if I can assist you in the future. Your Game plan/ To Do List    Follow up Visits: Next Medicare AWV with our clinical staff: 06/24/25 at 10:40a.m.   Have you seen your provider in the last 6 months (3 months if uncontrolled diabetes)? Yes Next Office Visit with your provider: 08/25/24 at 9:30a.m.  Clinician Recommendations:  Aim for 30 minutes of exercise or brisk walking, 6-8 glasses of water , and 5 servings of fruits and vegetables each day.       This is a list of the screening recommended for you and due dates:  Health Maintenance  Topic Date Due   Zoster (Shingles) Vaccine (1 of 2) Never done   Medicare Annual Wellness Visit  06/20/2024   COVID-19 Vaccine (3 - 2024-25 season) 07/09/2025*   Flu Shot  07/10/2024   Hemoglobin A1C  11/18/2024   Yearly kidney function blood test for diabetes  02/06/2025   Yearly kidney health urinalysis for diabetes  02/06/2025   Eye exam for diabetics  03/19/2025   Complete foot exam   05/19/2025   DTaP/Tdap/Td vaccine (3 - Td or Tdap) 11/24/2028   Colon Cancer Screening  09/18/2032   Pneumococcal Vaccine for age over 40  Completed   Hepatitis C Screening  Completed   Hepatitis B Vaccine  Aged Out   HPV Vaccine  Aged Out   Meningitis B Vaccine  Aged Out  *Topic was postponed. The date shown is not the original due date.    Advanced directives: (Declined) Advance directive discussed with you today. Even though you declined this today, please call our office should you change your mind, and we can give you the proper paperwork for you to fill out. Advance Care Planning is important because it:  [x]  Makes sure  you receive the medical care that is consistent with your values, goals, and preferences  [x]  It provides guidance to your family and loved ones and reduces their decisional burden about whether or not they are making the right decisions based on your wishes.  Follow the link provided in your after visit summary or read over the paperwork we have mailed to you to help you started getting your Advance Directives in place. If you need assistance in completing these, please reach out to us  so that we can help you!  See attachments for Preventive Care and Fall Prevention Tips.

## 2024-06-30 ENCOUNTER — Telehealth: Payer: Self-pay | Admitting: Family Medicine

## 2024-06-30 ENCOUNTER — Other Ambulatory Visit

## 2024-06-30 DIAGNOSIS — B351 Tinea unguium: Secondary | ICD-10-CM

## 2024-06-30 DIAGNOSIS — Z0279 Encounter for issue of other medical certificate: Secondary | ICD-10-CM

## 2024-06-30 NOTE — Telephone Encounter (Signed)
 Gary Lopez dropped off Handicap forms to be completed and signed.  Form Fee Paid? (Y/N)       y     If NO, form is placed on front office manager desk to hold until payment received. If YES, then form will be placed in the RX/HH Nurse Coordinators box for completion.  Form will not be processed until payment is received [facility]

## 2024-06-30 NOTE — Addendum Note (Signed)
 Addended by: VICCI SELLER A on: 06/30/2024 09:46 AM   Modules accepted: Orders

## 2024-06-30 NOTE — Progress Notes (Signed)
 Remote pacemaker transmission.

## 2024-07-01 LAB — HEPATIC FUNCTION PANEL
ALT: 34 IU/L (ref 0–44)
AST: 28 IU/L (ref 0–40)
Albumin: 4.3 g/dL (ref 3.8–4.8)
Alkaline Phosphatase: 80 IU/L (ref 44–121)
Bilirubin Total: 0.5 mg/dL (ref 0.0–1.2)
Bilirubin, Direct: 0.19 mg/dL (ref 0.00–0.40)
Total Protein: 6.9 g/dL (ref 6.0–8.5)

## 2024-07-01 NOTE — Telephone Encounter (Signed)
LMOV[facility] handicap form ready

## 2024-08-14 ENCOUNTER — Ambulatory Visit (INDEPENDENT_AMBULATORY_CARE_PROVIDER_SITE_OTHER): Payer: Medicare Other

## 2024-08-14 DIAGNOSIS — I480 Paroxysmal atrial fibrillation: Secondary | ICD-10-CM | POA: Diagnosis not present

## 2024-08-15 LAB — CUP PACEART REMOTE DEVICE CHECK
Battery Remaining Longevity: 120 mo
Battery Remaining Percentage: 95.5 %
Battery Voltage: 2.99 V
Brady Statistic AP VP Percent: 1 %
Brady Statistic AP VS Percent: 1 %
Brady Statistic AS VP Percent: 29 %
Brady Statistic AS VS Percent: 69 %
Brady Statistic RA Percent Paced: 1.6 %
Brady Statistic RV Percent Paced: 30 %
Date Time Interrogation Session: 20250905020013
Implantable Lead Connection Status: 753985
Implantable Lead Connection Status: 753985
Implantable Lead Implant Date: 20241205
Implantable Lead Implant Date: 20241205
Implantable Lead Location: 753859
Implantable Lead Location: 753860
Implantable Pulse Generator Implant Date: 20241205
Lead Channel Impedance Value: 480 Ohm
Lead Channel Impedance Value: 480 Ohm
Lead Channel Pacing Threshold Amplitude: 0.5 V
Lead Channel Pacing Threshold Amplitude: 0.75 V
Lead Channel Pacing Threshold Pulse Width: 0.5 ms
Lead Channel Pacing Threshold Pulse Width: 0.5 ms
Lead Channel Sensing Intrinsic Amplitude: 12 mV
Lead Channel Sensing Intrinsic Amplitude: 5 mV
Lead Channel Setting Pacing Amplitude: 1 V
Lead Channel Setting Pacing Amplitude: 2 V
Lead Channel Setting Pacing Pulse Width: 0.5 ms
Lead Channel Setting Sensing Sensitivity: 4 mV
Pulse Gen Model: 2272
Pulse Gen Serial Number: 8232117

## 2024-08-22 NOTE — Progress Notes (Signed)
 Remote PPM Transmission

## 2024-08-25 ENCOUNTER — Ambulatory Visit: Admitting: Family Medicine

## 2024-08-25 ENCOUNTER — Encounter: Payer: Self-pay | Admitting: Family Medicine

## 2024-08-25 ENCOUNTER — Ambulatory Visit (INDEPENDENT_AMBULATORY_CARE_PROVIDER_SITE_OTHER): Admitting: Family Medicine

## 2024-08-25 VITALS — BP 111/73 | HR 80 | Temp 98.0°F | Ht 68.0 in | Wt 197.5 lb

## 2024-08-25 DIAGNOSIS — F99 Mental disorder, not otherwise specified: Secondary | ICD-10-CM

## 2024-08-25 DIAGNOSIS — E785 Hyperlipidemia, unspecified: Secondary | ICD-10-CM | POA: Diagnosis not present

## 2024-08-25 DIAGNOSIS — E1159 Type 2 diabetes mellitus with other circulatory complications: Secondary | ICD-10-CM | POA: Diagnosis not present

## 2024-08-25 DIAGNOSIS — Z2821 Immunization not carried out because of patient refusal: Secondary | ICD-10-CM | POA: Diagnosis not present

## 2024-08-25 DIAGNOSIS — I152 Hypertension secondary to endocrine disorders: Secondary | ICD-10-CM | POA: Diagnosis not present

## 2024-08-25 DIAGNOSIS — E119 Type 2 diabetes mellitus without complications: Secondary | ICD-10-CM

## 2024-08-25 DIAGNOSIS — Z0001 Encounter for general adult medical examination with abnormal findings: Secondary | ICD-10-CM | POA: Diagnosis not present

## 2024-08-25 DIAGNOSIS — R351 Nocturia: Secondary | ICD-10-CM

## 2024-08-25 DIAGNOSIS — Z6379 Other stressful life events affecting family and household: Secondary | ICD-10-CM

## 2024-08-25 DIAGNOSIS — E1169 Type 2 diabetes mellitus with other specified complication: Secondary | ICD-10-CM

## 2024-08-25 DIAGNOSIS — Z7984 Long term (current) use of oral hypoglycemic drugs: Secondary | ICD-10-CM | POA: Diagnosis not present

## 2024-08-25 DIAGNOSIS — Z0000 Encounter for general adult medical examination without abnormal findings: Secondary | ICD-10-CM

## 2024-08-25 LAB — BAYER DCA HB A1C WAIVED: HB A1C (BAYER DCA - WAIVED): 7.1 % — ABNORMAL HIGH (ref 4.8–5.6)

## 2024-08-25 MED ORDER — LOSARTAN POTASSIUM 50 MG PO TABS: 50.0000 mg | ORAL_TABLET | Freq: Every day | ORAL | 3 refills | Status: DC

## 2024-08-25 MED ORDER — TAMSULOSIN HCL 0.4 MG PO CAPS: 0.4000 mg | ORAL_CAPSULE | Freq: Every day | ORAL | 3 refills | Status: AC

## 2024-08-25 MED ORDER — JANUMET XR 100-1000 MG PO TB24: 1.0000 | ORAL_TABLET | Freq: Every morning | ORAL | 3 refills | Status: AC

## 2024-08-25 MED ORDER — AMLODIPINE BESYLATE 10 MG PO TABS: 10.0000 mg | ORAL_TABLET | Freq: Every day | ORAL | 3 refills | Status: DC

## 2024-08-25 MED ORDER — ATORVASTATIN CALCIUM 40 MG PO TABS: 40.0000 mg | ORAL_TABLET | Freq: Every day | ORAL | 3 refills | Status: DC
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
## 2024-08-26 ENCOUNTER — Ambulatory Visit: Payer: Self-pay | Admitting: Cardiovascular Disease

## 2024-08-26 ENCOUNTER — Ambulatory Visit: Payer: Self-pay | Admitting: Family Medicine

## 2024-08-26 LAB — TSH: TSH: 1.9 u[IU]/mL (ref 0.450–4.500)

## 2024-08-26 LAB — PSA: Prostate Specific Ag, Serum: 0.5 ng/mL (ref 0.0–4.0)

## 2024-08-26 LAB — CMP14+EGFR
ALT: 28 IU/L (ref 0–44)
AST: 31 IU/L (ref 0–40)
Albumin: 4.5 g/dL (ref 3.8–4.8)
Alkaline Phosphatase: 88 IU/L (ref 47–123)
BUN/Creatinine Ratio: 17 (ref 10–24)
BUN: 17 mg/dL (ref 8–27)
Bilirubin Total: 0.6 mg/dL (ref 0.0–1.2)
CO2: 21 mmol/L (ref 20–29)
Calcium: 9.7 mg/dL (ref 8.6–10.2)
Chloride: 100 mmol/L (ref 96–106)
Creatinine, Ser: 0.99 mg/dL (ref 0.76–1.27)
Globulin, Total: 2.8 g/dL (ref 1.5–4.5)
Glucose: 150 mg/dL — ABNORMAL HIGH (ref 70–99)
Potassium: 4.2 mmol/L (ref 3.5–5.2)
Sodium: 136 mmol/L (ref 134–144)
Total Protein: 7.3 g/dL (ref 6.0–8.5)
eGFR: 80 mL/min/1.73 (ref >59)

## 2024-08-26 LAB — VITAMIN B12: Vitamin B-12: 392 pg/mL (ref 232–1245)

## 2024-08-26 LAB — RPR: RPR Ser Ql: NONREACTIVE

## 2024-08-28 ENCOUNTER — Telehealth: Payer: Self-pay

## 2024-08-28 NOTE — Telephone Encounter (Signed)
Called patient and had to leave a message for patient to call back.

## 2024-08-28 NOTE — Telephone Encounter (Signed)
 Copied from CRM [id number]. Topic: General - Call Back - No Documentation >> Aug 28, 2024  9:45 AM Harlene ORN wrote: Reason for CRM: Wife called requesting a callback from the PCP's nurse about the prescription tamsulosin  (FLOMAX ) 0.4 MG CAPS capsule. Phone: [phone]

## 2024-08-28 NOTE — Telephone Encounter (Signed)
 If I remember correctly, he was going to see if it was helpful for night time urination? If not, ok to use only prn

## 2024-08-28 NOTE — Telephone Encounter (Signed)
 Called and spoke with wife she want to make sure he is suppose to start back taking the Flomax . Patients wife states he was only prescribed this during his kidney stones, but refill was sent for 90 days with 3 RF. Please advise wife aware we will call back.

## 2024-08-28 NOTE — Telephone Encounter (Signed)
 Advised patients wife of Dr. Melba recommendations.

## 2024-09-06 ENCOUNTER — Other Ambulatory Visit: Payer: Self-pay | Admitting: Family Medicine

## 2024-09-06 DIAGNOSIS — E1165 Type 2 diabetes mellitus with hyperglycemia: Secondary | ICD-10-CM

## 2024-11-13 ENCOUNTER — Ambulatory Visit: Payer: Medicare Other

## 2024-11-13 DIAGNOSIS — I480 Paroxysmal atrial fibrillation: Secondary | ICD-10-CM

## 2024-11-15 LAB — CUP PACEART REMOTE DEVICE CHECK
Battery Remaining Longevity: 115 mo
Battery Remaining Percentage: 94 %
Battery Voltage: 2.99 V
Brady Statistic AP VP Percent: 1.1 %
Brady Statistic AP VS Percent: 1 %
Brady Statistic AS VP Percent: 48 %
Brady Statistic AS VS Percent: 50 %
Brady Statistic RA Percent Paced: 1.6 %
Brady Statistic RV Percent Paced: 49 %
Date Time Interrogation Session: 20251205025724
Implantable Lead Connection Status: 753985
Implantable Lead Connection Status: 753985
Implantable Lead Implant Date: 20241205
Implantable Lead Implant Date: 20241205
Implantable Lead Location: 753859
Implantable Lead Location: 753860
Implantable Pulse Generator Implant Date: 20241205
Lead Channel Impedance Value: 440 Ohm
Lead Channel Impedance Value: 480 Ohm
Lead Channel Pacing Threshold Amplitude: 0.5 V
Lead Channel Pacing Threshold Amplitude: 0.75 V
Lead Channel Pacing Threshold Pulse Width: 0.5 ms
Lead Channel Pacing Threshold Pulse Width: 0.5 ms
Lead Channel Sensing Intrinsic Amplitude: 12 mV
Lead Channel Sensing Intrinsic Amplitude: 5 mV
Lead Channel Setting Pacing Amplitude: 1 V
Lead Channel Setting Pacing Amplitude: 2 V
Lead Channel Setting Pacing Pulse Width: 0.5 ms
Lead Channel Setting Sensing Sensitivity: 4 mV
Pulse Gen Model: 2272
Pulse Gen Serial Number: 8232117

## 2024-11-17 NOTE — Progress Notes (Signed)
 Remote PPM Transmission

## 2024-11-26 ENCOUNTER — Ambulatory Visit: Payer: Self-pay | Admitting: Cardiovascular Disease

## 2024-11-30 NOTE — Telephone Encounter (Signed)
 Call x1; lvmtcb to schedule patient with device clinic to make programming changes from 12/5 remote check per Dr. Nancey.

## 2024-12-07 NOTE — Telephone Encounter (Signed)
 Patients son returned my call - patient is scheduled for device clinic appt on 1/8.

## 2024-12-07 NOTE — Telephone Encounter (Signed)
 Patient returned call - he is unsure of coming to [facility], he inquired to see if this would be in [place] but I let the patient know we do not have a device clinic in [place]. He requested to get back with me after speaking with his son. He stated it would be ok that I check in with him if I dont hear from him.

## 2024-12-07 NOTE — Telephone Encounter (Signed)
 Call x2; lvmtcb @ mobile/home phone not in service - to schedule patient with device clinic to make programming changes from 12/5 remote check per Dr. Nancey.

## 2024-12-11 ENCOUNTER — Telehealth: Payer: Self-pay | Admitting: Family Medicine

## 2024-12-11 DIAGNOSIS — E1159 Type 2 diabetes mellitus with other circulatory complications: Secondary | ICD-10-CM

## 2024-12-11 DIAGNOSIS — E1169 Type 2 diabetes mellitus with other specified complication: Secondary | ICD-10-CM

## 2024-12-11 MED ORDER — ATORVASTATIN CALCIUM 40 MG PO TABS: 40.0000 mg | ORAL_TABLET | Freq: Every day | ORAL | 3 refills | Status: AC

## 2024-12-11 MED ORDER — LOSARTAN POTASSIUM 50 MG PO TABS: 50.0000 mg | ORAL_TABLET | Freq: Every day | ORAL | 3 refills | Status: AC

## 2024-12-11 MED ORDER — AMLODIPINE BESYLATE 10 MG PO TABS: 10.0000 mg | ORAL_TABLET | Freq: Every day | ORAL | 3 refills | Status: AC

## 2024-12-11 NOTE — Telephone Encounter (Signed)
" °  Prescription Request  12/11/2024  Is this a Controlled Substance medicine? NO  Have you seen your PCP in the last 2 weeks? PT HAS APPT IN [facility] FOR 6 MONTH [facility]  If YES, route message to pool  -  If NO, patient needs to be scheduled for appointment.  What is the name of the medication or equipment? PT NEEDS HIS 2 BLOOD PRESSURE MEDS AND CHOLESTEROL CALLED IN  Have you contacted your pharmacy to request a refill? YES   Which pharmacy would you like this sent to? [facility]   Patient notified that their request is being sent to the clinical staff for review and that they should receive a response within 2 business days.    "

## 2024-12-11 NOTE — Telephone Encounter (Signed)
 Patient has refills out pharmacy. Patient states pharmacy is telling him they do not. Will resend for patient.

## 2024-12-17 ENCOUNTER — Ambulatory Visit: Attending: Student in an Organized Health Care Education/Training Program

## 2024-12-17 DIAGNOSIS — I442 Atrioventricular block, complete: Secondary | ICD-10-CM

## 2024-12-17 LAB — CUP PACEART INCLINIC DEVICE CHECK
Battery Remaining Longevity: 120 mo
Battery Voltage: 2.99 V
Brady Statistic RA Percent Paced: 1.6 %
Brady Statistic RV Percent Paced: 55 %
Date Time Interrogation Session: 20260108091924
Implantable Lead Connection Status: 753985
Implantable Lead Connection Status: 753985
Implantable Lead Implant Date: 20241205
Implantable Lead Implant Date: 20241205
Implantable Lead Location: 753859
Implantable Lead Location: 753860
Implantable Pulse Generator Implant Date: 20241205
Lead Channel Impedance Value: 475 Ohm
Lead Channel Impedance Value: 487.5 Ohm
Lead Channel Pacing Threshold Amplitude: 0.625 V
Lead Channel Pacing Threshold Pulse Width: 0.5 ms
Lead Channel Sensing Intrinsic Amplitude: 12 mV
Lead Channel Sensing Intrinsic Amplitude: 5 mV
Lead Channel Setting Pacing Amplitude: 0.875
Lead Channel Setting Pacing Amplitude: 2 V
Lead Channel Setting Pacing Pulse Width: 0.5 ms
Lead Channel Setting Sensing Sensitivity: 4 mV
Pulse Gen Model: 2272
Pulse Gen Serial Number: 8232117

## 2024-12-17 NOTE — Patient Instructions (Signed)
 Follow up as scheduled.

## 2024-12-17 NOTE — Progress Notes (Signed)
 Full device check not complete. 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 Presenting rhythm AS/VP 98. 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 See attachment for details and programming changes below.  Programming Changes: - PVAB reprogrammed from to .   Patient scheduled for 1 yr F/U w/ Dr. Nancey in [place] on 02/12/2025 @ 9:15am.

## 2025-02-12 ENCOUNTER — Ambulatory Visit: Admitting: Cardiovascular Disease

## 2025-02-12 ENCOUNTER — Ambulatory Visit: Payer: Self-pay | Admitting: Family Medicine

## 2025-06-24 ENCOUNTER — Ambulatory Visit: Payer: Self-pay
